# Patient Record
Sex: Female | Born: 1980 | Hispanic: No | Marital: Married | Smoking: Former smoker
Health system: Southern US, Community
[De-identification: ages and names within clinical notes are randomized; demographics above are authoritative.]

## PROBLEM LIST (undated history)

## (undated) ENCOUNTER — Inpatient Hospital Stay (HOSPITAL_COMMUNITY): Payer: Self-pay

## (undated) DIAGNOSIS — O24419 Gestational diabetes mellitus in pregnancy, unspecified control: Secondary | ICD-10-CM

## (undated) DIAGNOSIS — B999 Unspecified infectious disease: Secondary | ICD-10-CM

## (undated) DIAGNOSIS — B977 Papillomavirus as the cause of diseases classified elsewhere: Secondary | ICD-10-CM

## (undated) DIAGNOSIS — Z98891 History of uterine scar from previous surgery: Secondary | ICD-10-CM

## (undated) HISTORY — DX: History of uterine scar from previous surgery: Z98.891

## (undated) HISTORY — DX: Gestational diabetes mellitus in pregnancy, unspecified control: O24.419

## (undated) HISTORY — DX: Papillomavirus as the cause of diseases classified elsewhere: B97.7

---

## 2013-09-02 ENCOUNTER — Encounter (HOSPITAL_COMMUNITY): Payer: Self-pay | Admitting: Emergency Medicine

## 2013-09-02 DIAGNOSIS — R300 Dysuria: Secondary | ICD-10-CM | POA: Insufficient documentation

## 2013-09-02 NOTE — ED Notes (Signed)
The pt is having painful burning urination since yesterday.  Hx of utis and she thinks this is the same.  lmp 5 days

## 2013-09-03 ENCOUNTER — Emergency Department (HOSPITAL_COMMUNITY)
Admission: EM | Admit: 2013-09-03 | Discharge: 2013-09-03 | Discharge status: Against Medical Advice | Payer: Medicaid Other | Attending: Emergency Medicine | Admitting: Emergency Medicine

## 2013-09-03 LAB — PREGNANCY, URINE: Preg Test, Ur: NEGATIVE

## 2013-09-03 LAB — URINALYSIS, ROUTINE W REFLEX MICROSCOPIC
Glucose, UA: NEGATIVE mg/dL
Leukocytes, UA: NEGATIVE
Specific Gravity, Urine: 1.021 (ref 1.005–1.030)
pH: 6.5 (ref 5.0–8.0)

## 2013-09-03 NOTE — ED Notes (Signed)
Delay explained to pt.  

## 2013-09-06 ENCOUNTER — Encounter (HOSPITAL_COMMUNITY): Payer: Self-pay | Admitting: Emergency Medicine

## 2013-09-06 ENCOUNTER — Emergency Department (HOSPITAL_COMMUNITY): Payer: Medicaid Other

## 2013-09-06 ENCOUNTER — Emergency Department (HOSPITAL_COMMUNITY)
Admission: EM | Admit: 2013-09-06 | Discharge: 2013-09-06 | Disposition: A | Discharge status: Home / Self Care | Payer: Medicaid Other | Attending: Emergency Medicine | Admitting: Emergency Medicine

## 2013-09-06 DIAGNOSIS — M549 Dorsalgia, unspecified: Secondary | ICD-10-CM | POA: Insufficient documentation

## 2013-09-06 DIAGNOSIS — R300 Dysuria: Secondary | ICD-10-CM | POA: Insufficient documentation

## 2013-09-06 DIAGNOSIS — R05 Cough: Secondary | ICD-10-CM | POA: Insufficient documentation

## 2013-09-06 DIAGNOSIS — N949 Unspecified condition associated with female genital organs and menstrual cycle: Secondary | ICD-10-CM | POA: Insufficient documentation

## 2013-09-06 DIAGNOSIS — N898 Other specified noninflammatory disorders of vagina: Secondary | ICD-10-CM | POA: Insufficient documentation

## 2013-09-06 DIAGNOSIS — R350 Frequency of micturition: Secondary | ICD-10-CM | POA: Insufficient documentation

## 2013-09-06 DIAGNOSIS — R059 Cough, unspecified: Secondary | ICD-10-CM | POA: Insufficient documentation

## 2013-09-06 LAB — URINALYSIS, ROUTINE W REFLEX MICROSCOPIC
Bilirubin Urine: NEGATIVE
Glucose, UA: NEGATIVE mg/dL
Nitrite: NEGATIVE
Specific Gravity, Urine: 1.011 (ref 1.005–1.030)
Urobilinogen, UA: 0.2 mg/dL (ref 0.0–1.0)

## 2013-09-06 LAB — WET PREP, GENITAL
Trich, Wet Prep: NONE SEEN
Yeast Wet Prep HPF POC: NONE SEEN

## 2013-09-06 MED ORDER — IBUPROFEN 200 MG PO TABS
600.0000 mg | ORAL_TABLET | Freq: Once | ORAL | Status: AC
  Administered 2013-09-06: 600 mg via ORAL
  Filled 2013-09-06: qty 3

## 2013-09-06 NOTE — ED Provider Notes (Signed)
CSN: [id number]     Arrival date & time 09/06/13  1111 History   First MD Initiated Contact with Patient 09/06/13 1130     Chief Complaint  Patient presents with  . Abdominal Pain   HPI 32 year old woman with PMH recurrent UTIs, ?kidney stone who presents with dysuria and suprapubic pain.   Patient was recently in ED on 12/7 for same complaints though negative UA at that time.  She was not sent home on any medication.  She states that burning with urination has worsened (total duration 1 week), and she has also started having suprapubic crampy (like menstrual period) pain since discharge.  Pain is worse after sexual intercourse.  Endorses frequency but no hematuria.  No vaginal discharge.  No constipation or diarrhea.    Occasional palpitations but no chest pain, shortness of breath, fevers, headaches, abdominal pain.     History reviewed. No pertinent past medical history. History reviewed. No pertinent past surgical history. No family history on file. History  Substance Use Topics  . Smoking status: Former Games developer  . Smokeless tobacco: Not on file  . Alcohol Use: No   OB History   Grav Para Term Preterm Abortions TAB SAB Ect Mult Living                 Review of Systems  Constitutional: Negative for fever and chills.  HENT: Negative for congestion.   Respiratory: Positive for cough. Negative for shortness of breath.   Cardiovascular: Negative for chest pain, palpitations and leg swelling.  Gastrointestinal: Negative for nausea, vomiting, abdominal pain, diarrhea, constipation and blood in stool.  Genitourinary: Positive for dysuria, frequency and pelvic pain. Negative for hematuria, vaginal bleeding, vaginal discharge and vaginal pain.  Musculoskeletal: Positive for back pain.  Neurological: Negative for dizziness, syncope, weakness and headaches.    Allergies  Review of patient's allergies indicates no known allergies.  Home Medications  multivitamin  BP 102/69  Pulse  83  Temp(Src) 98.1 F (36.7 C) (Oral)  Resp 18  SpO2 100%  LMP 08/29/2013 Physical Exam  Constitutional: She is oriented to person, place, and time. She appears well-developed and well-nourished. No distress.  HENT:  Head: Normocephalic and atraumatic.  Eyes: Conjunctivae and EOM are normal. Pupils are equal, round, and reactive to light.  Neck: Normal range of motion. Neck supple.  Cardiovascular: Normal rate, regular rhythm and normal heart sounds.   Pulmonary/Chest: Effort normal and breath sounds normal. She has no wheezes. She has no rales.  Abdominal: Soft. Bowel sounds are normal. She exhibits no distension. There is tenderness. There is no rebound and no guarding.  Mild tenderness to palpation in suprapubic area (R>L)  Genitourinary: Uterus normal. Vaginal discharge found.  Mild white discharge Bimanual exam revealed adnexal tenderness on left.   Musculoskeletal: Normal range of motion.  Neurological: She is alert and oriented to person, place, and time. No cranial nerve deficit.  Skin: Skin is warm and dry.    ED Course  Procedures (including critical care time) Labs Review Labs Reviewed  URINALYSIS, ROUTINE W REFLEX MICROSCOPIC - Abnormal; Notable for the following:    Hgb urine dipstick TRACE (*)    All other components within normal limits  WET PREP, GENITAL  GC/CHLAMYDIA PROBE AMP  URINE MICROSCOPIC-ADD ON   Imaging Review US Transvaginal Non-ob  09/06/2013   CLINICAL DATA:  Abdominal and pelvic pain.  EXAM: TRANSABDOMINAL AND TRANSVAGINAL ULTRASOUND OF PELVIS  TECHNIQUE: Both transabdominal and transvaginal ultrasound examinations of the  pelvis were performed. Transabdominal technique was performed for global imaging of the pelvis including uterus, ovaries, adnexal regions, and pelvic cul-de-sac. It was necessary to proceed with endovaginal exam following the transabdominal exam to visualize the endometrium and ovaries.  COMPARISON:  None  FINDINGS: Uterus   Measurements: 9.9 x 5.1 x 5.7 cm. No fibroids or other mass visualized.  Endometrium  Thickness: 11 mm.  No focal abnormality visualized.  Right ovary  Measurements: 3.2 x 3.6 x 3.6 cm. Normal appearance/no adnexal mass.  Left ovary  Measurements: 2.8 x 1.6 x 2.0 cm. Normal appearance/no adnexal mass.  Other findings  Trace amount of free fluid in pelvic cul-de-sac.  IMPRESSION: Normal appearance of uterus and both ovaries. No evidence of pelvic mass or other significant abnormality.   Electronically Signed   By: Myles Rosenthal M.D.   On: 09/06/2013 13:51   US Pelvis Complete  09/06/2013   CLINICAL DATA:  Abdominal and pelvic pain.  EXAM: TRANSABDOMINAL AND TRANSVAGINAL ULTRASOUND OF PELVIS  TECHNIQUE: Both transabdominal and transvaginal ultrasound examinations of the pelvis were performed. Transabdominal technique was performed for global imaging of the pelvis including uterus, ovaries, adnexal regions, and pelvic cul-de-sac. It was necessary to proceed with endovaginal exam following the transabdominal exam to visualize the endometrium and ovaries.  COMPARISON:  None  FINDINGS: Uterus  Measurements: 9.9 x 5.1 x 5.7 cm. No fibroids or other mass visualized.  Endometrium  Thickness: 11 mm.  No focal abnormality visualized.  Right ovary  Measurements: 3.2 x 3.6 x 3.6 cm. Normal appearance/no adnexal mass.  Left ovary  Measurements: 2.8 x 1.6 x 2.0 cm. Normal appearance/no adnexal mass.  Other findings  Trace amount of free fluid in pelvic cul-de-sac.  IMPRESSION: Normal appearance of uterus and both ovaries. No evidence of pelvic mass or other significant abnormality.   Electronically Signed   By: Myles Rosenthal M.D.   On: 09/06/2013 13:51    EKG Interpretation   None       MDM  Dysuria, suprapubic pain- Worsening since she was seen earlier this week.  Afebrile, VSS.  UA negative again today except for trace heme. Urine pregnancy test negative earlier this week.  Pelvic exam with mild whitish discharge, mild  left adnexal tenderness.  Wet prep negative.  Normal pelvic ultrasound.  GC/chlamydia pending.   Discharge home with ibuprofen 600 mg q8h prn pain and follow-up with Dr. Wandra Mannan of [facility] in [facility] (patient prefers to make this appointment herself).  All questions answered with use of Arabic translator on phone.         Rocco Serene, MD 09/06/13 [phone]

## 2013-09-06 NOTE — ED Provider Notes (Signed)
Medical screening examination/treatment/procedure(s) were conducted as a shared visit with non-physician practitioner(s) or resident and myself. I personally evaluated the patient during the encounter and agree with the findings and plan unless otherwise indicated.  I have personally reviewed any xrays and/ or EKG's with the provider and I agree with interpretation.  Dysuria and left pelvic pain for a few days. Nothing improving. No new discharge or bleeding, not currently pregnant. Past smoker. Exam mild suprapubic and left lower pelvic, no CMT, mild white discharge, mild left adnexal pain, abd soft/ NT. Plan for pelvic cultures and US pelvic. [place] no acute findings.  Pt has outpt fup.  Pelvic pain LLQ, Dysuria Labs Reviewed  URINALYSIS, ROUTINE W REFLEX MICROSCOPIC - Abnormal; Notable for the following:    Hgb urine dipstick TRACE (*)    All other components within normal limits  WET PREP, GENITAL  GC/CHLAMYDIA PROBE AMP  URINE MICROSCOPIC-ADD ON     US Transvaginal Non-ob  09/06/2013   CLINICAL DATA:  Abdominal and pelvic pain.  EXAM: TRANSABDOMINAL AND TRANSVAGINAL ULTRASOUND OF PELVIS  TECHNIQUE: Both transabdominal and transvaginal ultrasound examinations of the pelvis were performed. Transabdominal technique was performed for global imaging of the pelvis including uterus, ovaries, adnexal regions, and pelvic cul-de-sac. It was necessary to proceed with endovaginal exam following the transabdominal exam to visualize the endometrium and ovaries.  COMPARISON:  None  FINDINGS: Uterus  Measurements: 9.9 x 5.1 x 5.7 cm. No fibroids or other mass visualized.  Endometrium  Thickness: 11 mm.  No focal abnormality visualized.  Right ovary  Measurements: 3.2 x 3.6 x 3.6 cm. Normal appearance/no adnexal mass.  Left ovary  Measurements: 2.8 x 1.6 x 2.0 cm. Normal appearance/no adnexal mass.  Other findings  Trace amount of free fluid in pelvic cul-de-sac.  IMPRESSION: Normal appearance of uterus and both  ovaries. No evidence of pelvic mass or other significant abnormality.   Electronically Signed   By: Myles Rosenthal M.D.   On: 09/06/2013 13:51   US Pelvis Complete  09/06/2013   CLINICAL DATA:  Abdominal and pelvic pain.  EXAM: TRANSABDOMINAL AND TRANSVAGINAL ULTRASOUND OF PELVIS  TECHNIQUE: Both transabdominal and transvaginal ultrasound examinations of the pelvis were performed. Transabdominal technique was performed for global imaging of the pelvis including uterus, ovaries, adnexal regions, and pelvic cul-de-sac. It was necessary to proceed with endovaginal exam following the transabdominal exam to visualize the endometrium and ovaries.  COMPARISON:  None  FINDINGS: Uterus  Measurements: 9.9 x 5.1 x 5.7 cm. No fibroids or other mass visualized.  Endometrium  Thickness: 11 mm.  No focal abnormality visualized.  Right ovary  Measurements: 3.2 x 3.6 x 3.6 cm. Normal appearance/no adnexal mass.  Left ovary  Measurements: 2.8 x 1.6 x 2.0 cm. Normal appearance/no adnexal mass.  Other findings  Trace amount of free fluid in pelvic cul-de-sac.  IMPRESSION: Normal appearance of uterus and both ovaries. No evidence of pelvic mass or other significant abnormality.   Electronically Signed   By: Myles Rosenthal M.D.   On: 09/06/2013 13:51     Enid Skeens, MD 09/06/13 1651

## 2013-09-06 NOTE — ED Notes (Signed)
uti s/s for a few days was seen here but not better

## 2013-09-07 LAB — GC/CHLAMYDIA PROBE AMP
CT Probe RNA: NEGATIVE
GC Probe RNA: NEGATIVE

## 2013-12-10 ENCOUNTER — Emergency Department (HOSPITAL_BASED_OUTPATIENT_CLINIC_OR_DEPARTMENT_OTHER)
Admission: EM | Admit: 2013-12-10 | Discharge: 2013-12-11 | Disposition: A | Discharge status: Home / Self Care | Payer: Medicaid Other | Attending: Emergency Medicine | Admitting: Emergency Medicine

## 2013-12-10 ENCOUNTER — Encounter (HOSPITAL_BASED_OUTPATIENT_CLINIC_OR_DEPARTMENT_OTHER): Payer: Self-pay | Admitting: Emergency Medicine

## 2013-12-10 DIAGNOSIS — Z87891 Personal history of nicotine dependence: Secondary | ICD-10-CM | POA: Insufficient documentation

## 2013-12-10 DIAGNOSIS — N390 Urinary tract infection, site not specified: Secondary | ICD-10-CM | POA: Insufficient documentation

## 2013-12-10 DIAGNOSIS — Z3202 Encounter for pregnancy test, result negative: Secondary | ICD-10-CM | POA: Insufficient documentation

## 2013-12-10 LAB — URINE MICROSCOPIC-ADD ON

## 2013-12-10 LAB — URINALYSIS, ROUTINE W REFLEX MICROSCOPIC
Bilirubin Urine: NEGATIVE
Glucose, UA: NEGATIVE mg/dL
Ketones, ur: NEGATIVE mg/dL
Nitrite: NEGATIVE
PH: 6 (ref 5.0–8.0)
Protein, ur: NEGATIVE mg/dL
SPECIFIC GRAVITY, URINE: 1.021 (ref 1.005–1.030)
UROBILINOGEN UA: 0.2 mg/dL (ref 0.0–1.0)

## 2013-12-10 LAB — PREGNANCY, URINE: Preg Test, Ur: NEGATIVE

## 2013-12-10 NOTE — ED Notes (Signed)
Dysuria x 3 days 

## 2013-12-10 NOTE — ED Notes (Signed)
PA at bedside.

## 2013-12-11 LAB — BASIC METABOLIC PANEL
BUN: 13 mg/dL (ref 6–23)
CALCIUM: 9.8 mg/dL (ref 8.4–10.5)
CO2: 27 mEq/L (ref 19–32)
Chloride: 98 mEq/L (ref 96–112)
Creatinine, Ser: 0.5 mg/dL (ref 0.50–1.10)
GFR calc non Af Amer: >90 mL/min (ref >90)
Glucose, Bld: 98 mg/dL (ref 70–99)
POTASSIUM: 4 meq/L (ref 3.7–5.3)
Sodium: 138 mEq/L (ref 137–147)

## 2013-12-11 MED ORDER — SULFAMETHOXAZOLE-TRIMETHOPRIM 800-160 MG PO TABS: 1.0000 | ORAL_TABLET | Freq: Two times a day (BID) | ORAL | Status: DC

## 2013-12-11 MED ORDER — PHENAZOPYRIDINE HCL 200 MG PO TABS: 200.0000 mg | ORAL_TABLET | Freq: Three times a day (TID) | ORAL | Status: DC

## 2013-12-11 NOTE — Discharge Instructions (Signed)
Urinary Tract Infection  Urinary tract infections (UTIs) can develop anywhere along your urinary tract. Your urinary tract is your body's drainage system for removing wastes and extra water. Your urinary tract includes two kidneys, two ureters, a bladder, and a urethra. Your kidneys are a pair of bean-shaped organs. Each kidney is about the size of your fist. They are located below your ribs, one on each side of your spine.  CAUSES  Infections are caused by microbes, which are microscopic organisms, including fungi, viruses, and bacteria. These organisms are so small that they can only be seen through a microscope. Bacteria are the microbes that most commonly cause UTIs.  SYMPTOMS   Symptoms of UTIs may vary by age and gender of the patient and by the location of the infection. Symptoms in young women typically include a frequent and intense urge to urinate and a painful, burning feeling in the bladder or urethra during urination. Older women and men are more likely to be tired, shaky, and weak and have muscle aches and abdominal pain. A fever may mean the infection is in your kidneys. Other symptoms of a kidney infection include pain in your back or sides below the ribs, nausea, and vomiting.  DIAGNOSIS  To diagnose a UTI, your caregiver will ask you about your symptoms. Your caregiver also will ask to provide a urine sample. The urine sample will be tested for bacteria and white blood cells. White blood cells are made by your body to help fight infection.  TREATMENT   Typically, UTIs can be treated with medication. Because most UTIs are caused by a bacterial infection, they usually can be treated with the use of antibiotics. The choice of antibiotic and length of treatment depend on your symptoms and the type of bacteria causing your infection.  HOME CARE INSTRUCTIONS   If you were prescribed antibiotics, take them exactly as your caregiver instructs you. Finish the medication even if you feel better after you  have only taken some of the medication.   Drink enough water and fluids to keep your urine clear or pale yellow.   Avoid caffeine, tea, and carbonated beverages. They tend to irritate your bladder.   Empty your bladder often. Avoid holding urine for long periods of time.   Empty your bladder before and after sexual intercourse.   After a bowel movement, women should cleanse from front to back. Use each tissue only once.  SEEK MEDICAL CARE IF:    You have back pain.   You develop a fever.   Your symptoms do not begin to resolve within 3 days.  SEEK IMMEDIATE MEDICAL CARE IF:    You have severe back pain or lower abdominal pain.   You develop chills.   You have nausea or vomiting.   You have continued burning or discomfort with urination.  MAKE SURE YOU:    Understand these instructions.   Will watch your condition.   Will get help right away if you are not doing well or get worse.  Document Released: 06/23/2005 Document Revised: 03/14/2012 Document Reviewed: 10/22/2011  ExitCare Patient Information 2014 [employer].

## 2013-12-19 NOTE — ED Provider Notes (Signed)
CSN: [id number]     Arrival date & time 12/10/13  1948 History   First MD Initiated Contact with Patient 12/10/13 2247     Chief Complaint  Patient presents with  . Dysuria     (Consider location/radiation/quality/duration/timing/severity/associated sxs/prior Treatment) Patient is a 33 y.o. female presenting with dysuria. The history is provided by the patient. No language interpreter was used.  Dysuria Pain quality:  Burning Pain severity:  Moderate Duration:  3 days Associated symptoms: flank pain   Associated symptoms: no abdominal pain, no fever, no nausea and no vaginal discharge   Associated symptoms comment:  She reports recurrent symptoms of dysuria for the past 3 days like previously diagnosed UTI's. She is concerned because she continues to have recurrent infection. She denies vaginal discharge, bleeding, fever, nausea or vomiting. She has intermittent bilateral flank pain.   History reviewed. No pertinent past medical history. History reviewed. No pertinent past surgical history. No family history on file. History  Substance Use Topics  . Smoking status: Former Research scientist (life sciences)  . Smokeless tobacco: Not on file  . Alcohol Use: No   OB History   Grav Para Term Preterm Abortions TAB SAB Ect Mult Living                 Review of Systems  Constitutional: Negative for fever and chills.  Respiratory: Negative.   Cardiovascular: Negative.   Gastrointestinal: Negative.  Negative for nausea and abdominal pain.  Genitourinary: Positive for dysuria, frequency, flank pain and pelvic pain. Negative for vaginal bleeding and vaginal discharge.  Musculoskeletal: Negative.   Skin: Negative.   Neurological: Negative.       Allergies  Review of patient's allergies indicates no known allergies.  Home Medications    BP 100/61  Pulse 66  Temp(Src) 99.5 F (37.5 C) (Oral)  Resp 18  Wt 155 lb (70.308 kg)  SpO2 100%  LMP 12/06/2013 Physical Exam  Constitutional: She is oriented  to person, place, and time. She appears well-developed and well-nourished.  Neck: Normal range of motion.  Pulmonary/Chest: Effort normal.  Abdominal: There is no tenderness. There is no rebound and no guarding.  Neurological: She is alert and oriented to person, place, and time.  Skin: Skin is warm and dry.  Psychiatric: She has a normal mood and affect.    ED Course  Procedures (including critical care time) Labs Review Labs Reviewed  URINALYSIS, ROUTINE W REFLEX MICROSCOPIC - Abnormal; Notable for the following:    Hgb urine dipstick SMALL (*)    Leukocytes, UA MODERATE (*)    All other components within normal limits  URINE MICROSCOPIC-ADD ON - Abnormal; Notable for the following:    Bacteria, UA FEW (*)    All other components within normal limits  PREGNANCY, URINE  BASIC METABOLIC PANEL   Imaging Review No results found.   EKG Interpretation None      MDM   Final diagnoses:  UTI (lower urinary tract infection)    Normal renal functions, urine has been cultured due to recurrent infection and small amount of WBC now. Rx Septra and urology follow up.    Dewaine Oats, PA-C 12/19/13 1711

## 2013-12-20 NOTE — ED Provider Notes (Signed)
Medical screening examination/treatment/procedure(s) were performed by non-physician practitioner and as supervising physician I was immediately available for consultation/collaboration.   EKG Interpretation None        Neta Ehlers, MD 12/20/13 1156

## 2014-12-31 ENCOUNTER — Ambulatory Visit: Payer: Medicaid Other

## 2015-02-05 IMAGING — US US TRANSVAGINAL NON-OB
1 series · 14 of 25 positions shown · non-contrast
Comparison: None

CLINICAL DATA: Abdominal and pelvic pain.

EXAM:
TRANSABDOMINAL AND TRANSVAGINAL ULTRASOUND OF PELVIS
TECHNIQUE: Both transabdominal and transvaginal ultrasound examinations of the
pelvis were performed. Transabdominal technique was performed for
global imaging of the pelvis including uterus, ovaries, adnexal
regions, and pelvic cul-de-sac. It was necessary to proceed with
endovaginal exam following the transabdominal exam to visualize the
endometrium and ovaries.

[Series 1: us transvaginal non-ob · 0.21mm/px · 14 of 70 slices shown]
[im 1/70]
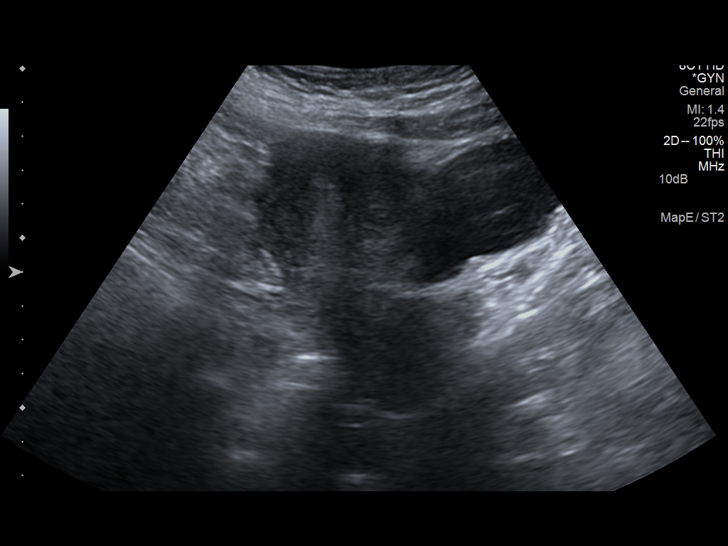
[im 6/70]
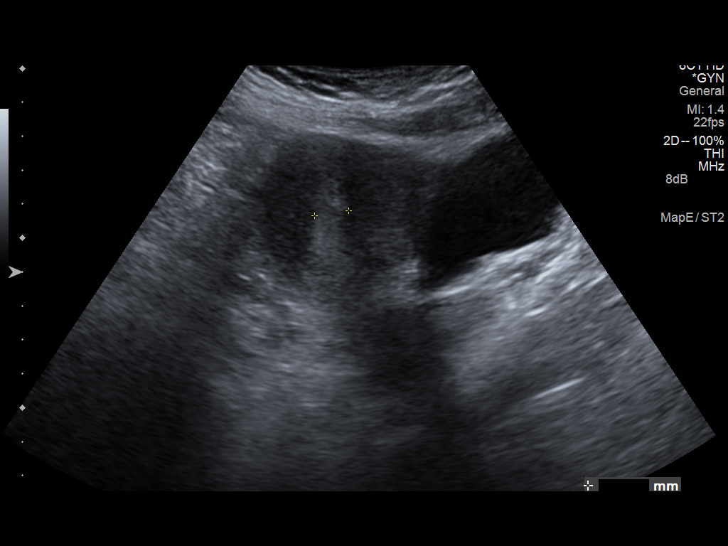
[im 12/70]
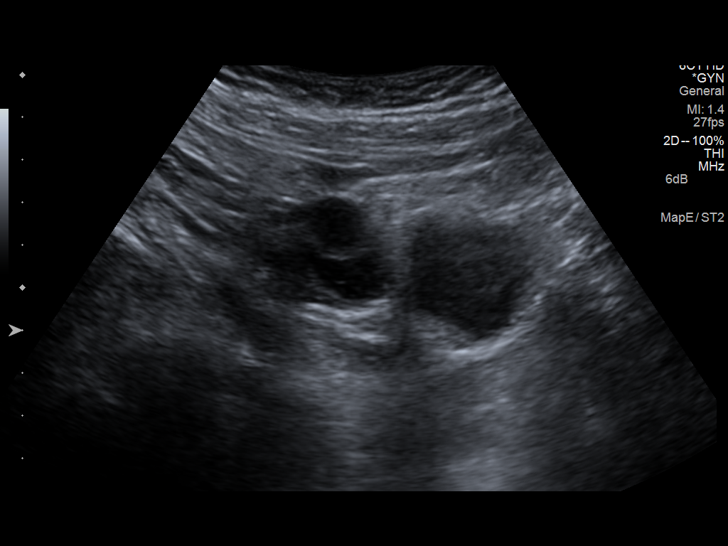
[im 18/70]
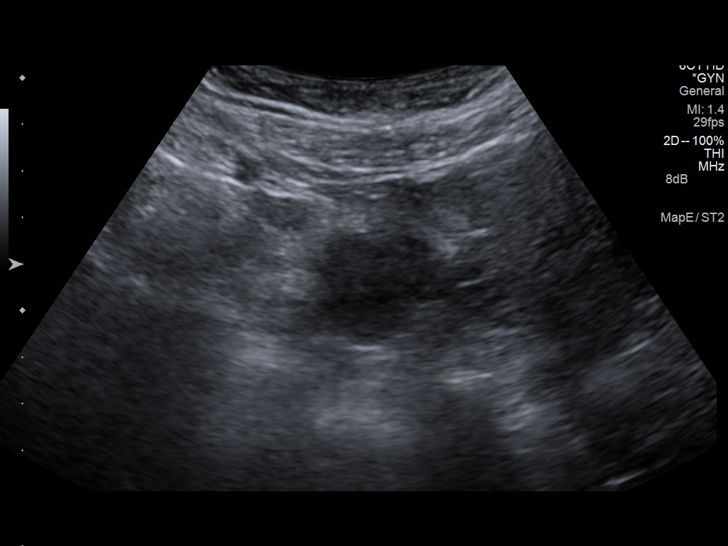
[im 24/70]
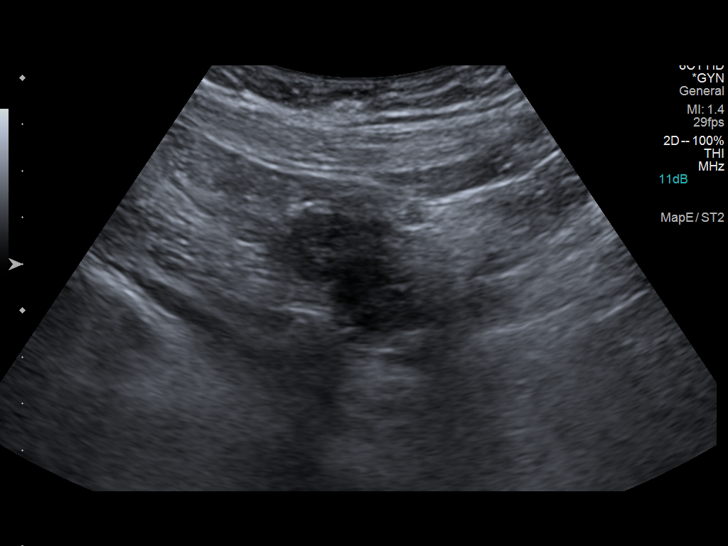
[im 26/70]
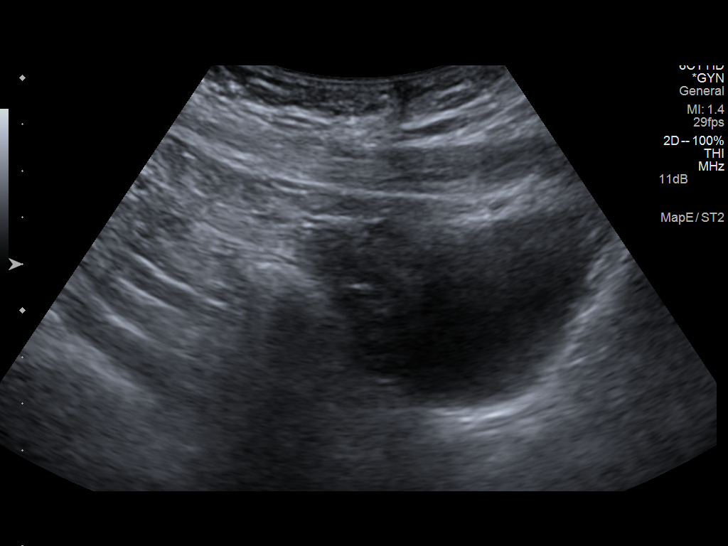
[im 32/70]
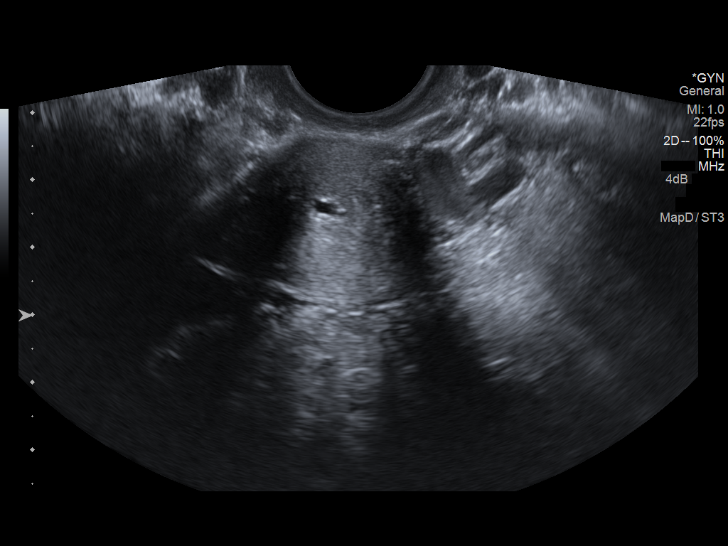
[im 38/70]
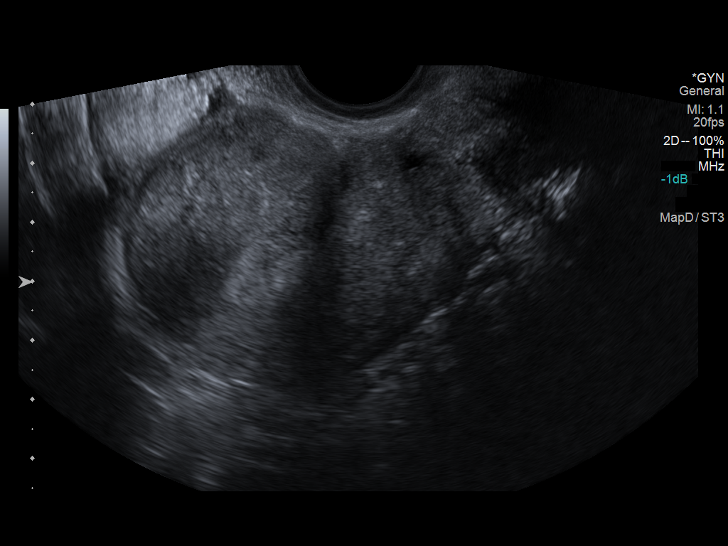
[im 44/70]
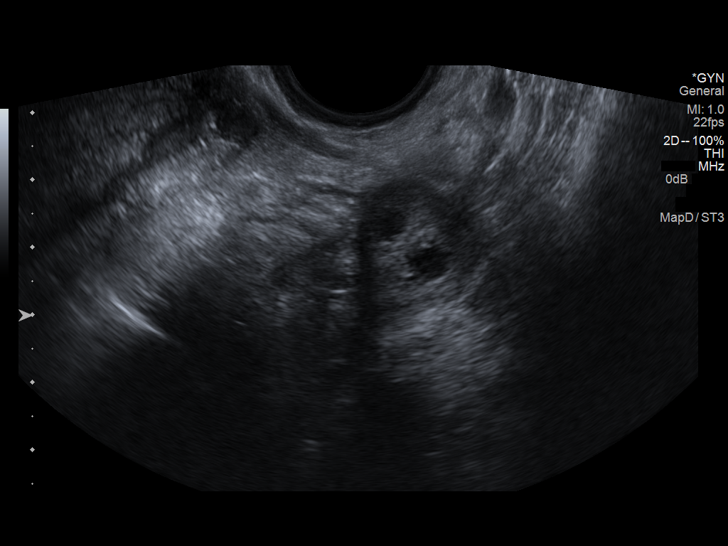
[im 47/70]
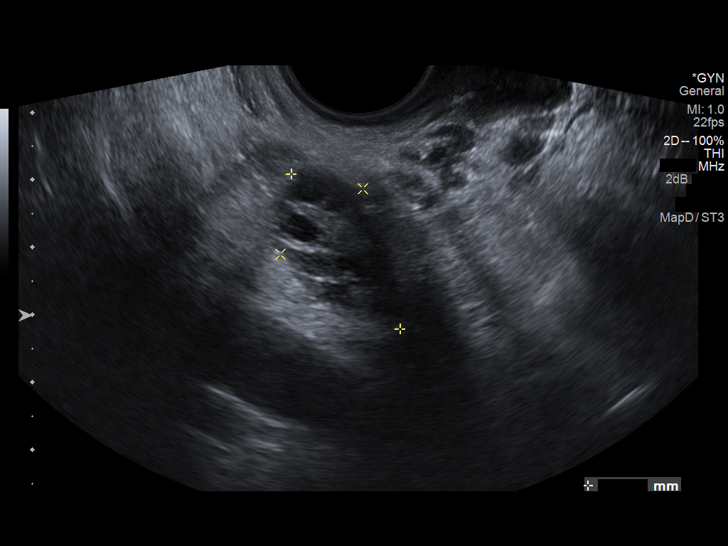
[im 52/70]
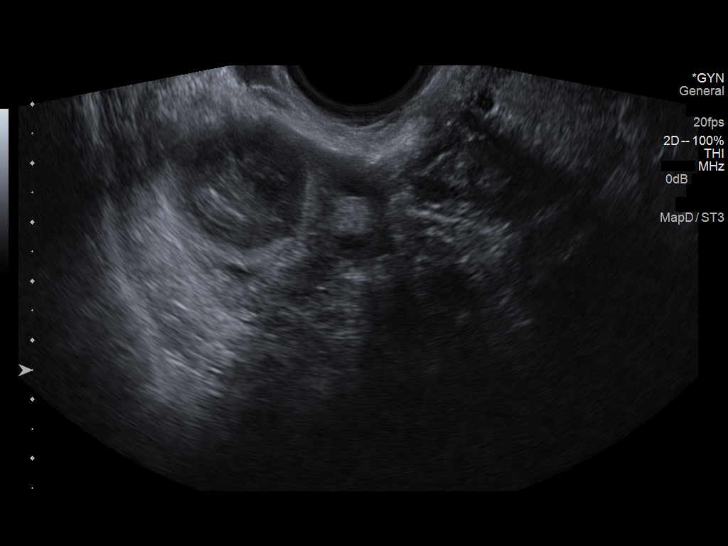
[im 58/70]
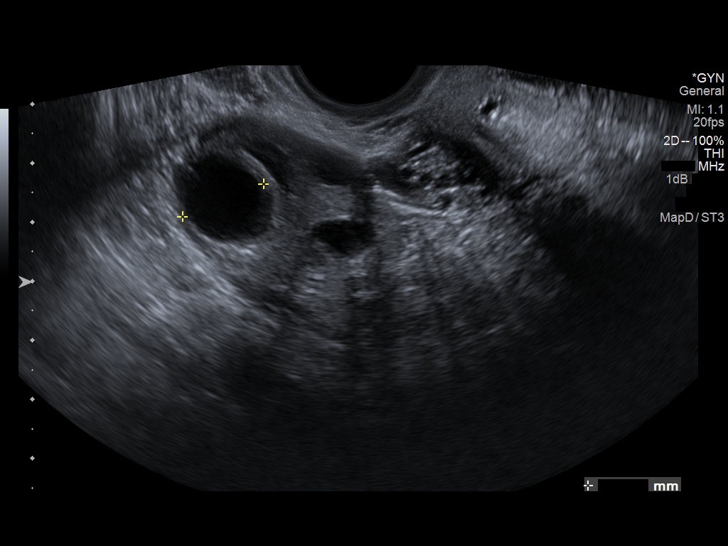
[im 64/70]
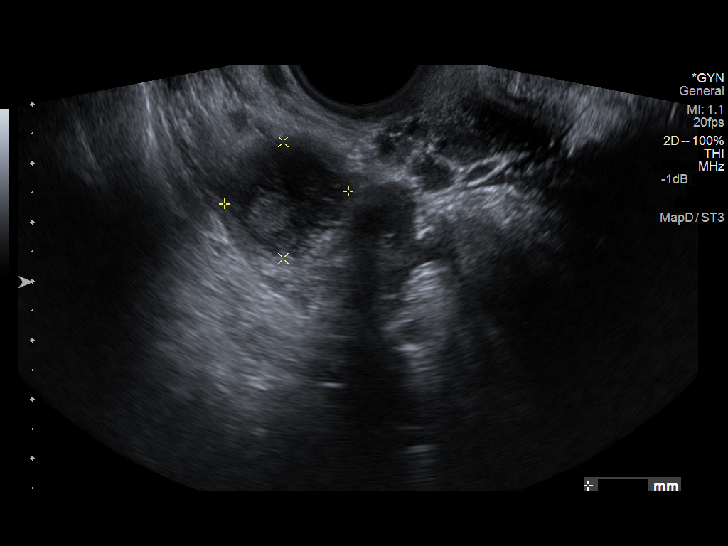
[im 70/70]
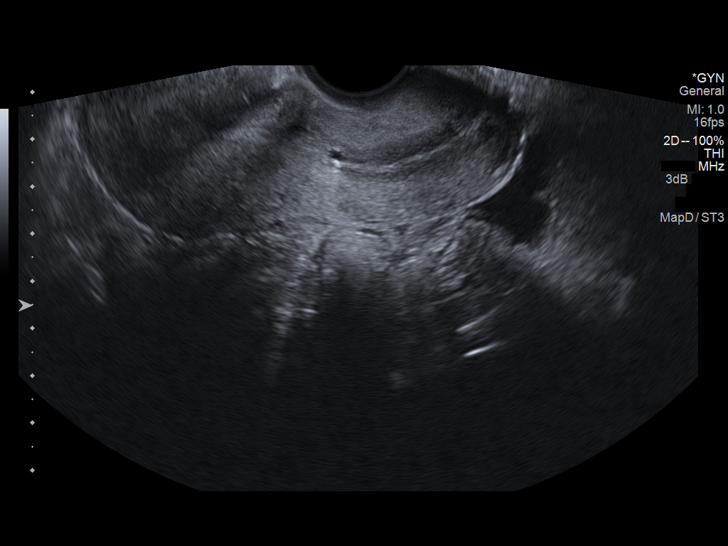

[14 of 25 positions shown; findings below may reference images not displayed]

FINDINGS: Uterus

Measurements: 9.9 x 5.1 x 5.7 cm. No fibroids or other mass
visualized.

Endometrium

Thickness: 11 mm.  No focal abnormality visualized.

Right ovary

Measurements: 3.2 x 3.6 x 3.6 cm. Normal appearance/no adnexal mass.

Left ovary

Measurements: 2.8 x 1.6 x 2.0 cm. Normal appearance/no adnexal mass.

Other findings

Trace amount of free fluid in pelvic cul-de-sac.
IMPRESSION: Normal appearance of uterus and both ovaries. No evidence of pelvic
mass or other significant abnormality.

## 2017-06-01 ENCOUNTER — Inpatient Hospital Stay (HOSPITAL_COMMUNITY): Admit: 2017-06-01 | Payer: Medicaid Other

## 2017-06-01 ENCOUNTER — Ambulatory Visit: Payer: Self-pay

## 2017-06-01 ENCOUNTER — Inpatient Hospital Stay (HOSPITAL_BASED_OUTPATIENT_CLINIC_OR_DEPARTMENT_OTHER): Payer: Medicaid Other

## 2017-06-01 ENCOUNTER — Inpatient Hospital Stay (HOSPITAL_COMMUNITY)
Admission: AD | Admit: 2017-06-01 | Discharge: 2017-06-01 | Disposition: A | Discharge status: Home / Self Care | Payer: Medicaid Other | Source: Ambulatory Visit | Attending: Obstetrics and Gynecology | Admitting: Obstetrics and Gynecology

## 2017-06-01 ENCOUNTER — Encounter (HOSPITAL_COMMUNITY): Payer: Self-pay | Admitting: *Deleted

## 2017-06-01 DIAGNOSIS — Z79899 Other long term (current) drug therapy: Secondary | ICD-10-CM | POA: Insufficient documentation

## 2017-06-01 DIAGNOSIS — Z3A11 11 weeks gestation of pregnancy: Secondary | ICD-10-CM | POA: Diagnosis not present

## 2017-06-01 DIAGNOSIS — O26891 Other specified pregnancy related conditions, first trimester: Secondary | ICD-10-CM | POA: Insufficient documentation

## 2017-06-01 DIAGNOSIS — O9989 Other specified diseases and conditions complicating pregnancy, childbirth and the puerperium: Secondary | ICD-10-CM

## 2017-06-01 DIAGNOSIS — M79609 Pain in unspecified limb: Secondary | ICD-10-CM | POA: Diagnosis present

## 2017-06-01 DIAGNOSIS — O34219 Maternal care for unspecified type scar from previous cesarean delivery: Secondary | ICD-10-CM | POA: Insufficient documentation

## 2017-06-01 DIAGNOSIS — Z87891 Personal history of nicotine dependence: Secondary | ICD-10-CM | POA: Diagnosis not present

## 2017-06-01 DIAGNOSIS — R109 Unspecified abdominal pain: Secondary | ICD-10-CM | POA: Insufficient documentation

## 2017-06-01 DIAGNOSIS — M79662 Pain in left lower leg: Secondary | ICD-10-CM | POA: Insufficient documentation

## 2017-06-01 DIAGNOSIS — Z833 Family history of diabetes mellitus: Secondary | ICD-10-CM | POA: Insufficient documentation

## 2017-06-01 DIAGNOSIS — O0931 Supervision of pregnancy with insufficient antenatal care, first trimester: Secondary | ICD-10-CM

## 2017-06-01 DIAGNOSIS — M7989 Other specified soft tissue disorders: Secondary | ICD-10-CM | POA: Insufficient documentation

## 2017-06-01 HISTORY — DX: Unspecified infectious disease: B99.9

## 2017-06-01 MED ORDER — CYCLOBENZAPRINE HCL 5 MG PO TABS: 5.0000 mg | ORAL_TABLET | Freq: Two times a day (BID) | ORAL | 0 refills | Status: DC | PRN

## 2017-06-01 NOTE — MAU Note (Signed)
Urine in lab 

## 2017-06-01 NOTE — Progress Notes (Signed)
Assumed care of pt. Informed pt waiting on results of test and may take awhile before being discharged.

## 2017-06-01 NOTE — MAU Provider Note (Signed)
  History     CSN: [id number]  Arrival date and time: 06/01/17 6073   First Provider Initiated Contact with Patient 06/01/17 1102      Chief Complaint  Patient presents with  . Leg Swelling  . Abdominal Pain  . Leg Pain   HPI  [name] is a 36 yo G3P2002 at 11.[redacted] wks gestation presenting with complaints of lower LT popliteal pain.  She reports flying from [place] to [place] the end of August then rode in a car from [place] 2 days ago.  She states the pain increases when she gets up to walk.  She reports "some swelling in both her legs".  Past Medical History:  Diagnosis Date  . Infection    UTI    Past Surgical History:  Procedure Laterality Date  . CESAREAN SECTION      Family History  Problem Relation Age of Onset  . Diabetes Father   . Asthma Neg Hx   . Cancer Neg Hx   . Hearing loss Neg Hx   . Hypertension Neg Hx   . Stroke Neg Hx     Social History  Substance Use Topics  . Smoking status: Former Research scientist (life sciences)  . Smokeless tobacco: Never Used     Comment: occ smoker, social not with preg  . Alcohol use No    Allergies: No Known Allergies  Prescriptions Prior to Admission  Medication Sig Dispense Refill Last Dose  . folic acid (FOLVITE) 710 MCG tablet Take 400 mcg by mouth daily.   Past Week at Unknown time  . UNABLE TO FIND Take 1 tablet by mouth every 6 (six) hours as needed (for pain). Med Name: bangol or pangol   05/31/2017 at Unknown time    Review of Systems  Constitutional: Negative.   HENT: Negative.   Eyes: Negative.   Respiratory: Negative.   Cardiovascular: Positive for leg swelling.  Endocrine: Negative.   Genitourinary: Negative.   Musculoskeletal: Negative.   Skin: Negative.   Allergic/Immunologic: Negative.   Neurological: Negative.   Hematological: Negative.   Psychiatric/Behavioral: Negative.    Physical Exam   Blood pressure 107/66, pulse 90, temperature 98.4 F (36.9 C), temperature source Oral, resp. rate 16, weight 83.6  kg (184 lb 6.4 oz), last menstrual period 03/11/2017, SpO2 100 %.  Physical Exam  Constitutional: She appears well-developed and well-nourished.  HENT:  Head: Normocephalic.  Eyes: Pupils are equal, round, and reactive to light.  Neck: Normal range of motion.  Musculoskeletal:       Left knee: Tenderness found.    MAU Course  Procedures  MDM FHTs by doppler: 147 bpm  Venous Doppler of Lower LT Extremity: *PRELIMINARY RESULTS* Vascular Ultrasound Left lower extremity venous duplex has been completed.  Preliminary findings: No evidence of deep vein thrombosis or baker's cysts in the left lower extremity.  Preliminary results given to [facility] @ 12:40. Everrett Coombe 06/01/2017, 12:41 PM  Assessment and Plan  Popliteal pain - Rx for Flexeril 5 mg po BID prn MSK pain - Stay well-hydrated with 8-10 glasses of water daily  No Prenatal Care in 1st trimester - Establish [facility] with OB provider of choice  - List of [facility] providers given  Discharge home Patient verbalized an understanding of the plan of care and agrees.   Laury Deep, MSN, CNM 06/01/2017, 11:11 AM

## 2017-06-01 NOTE — MAU Note (Signed)
Having pain in left leg only, behind the knee.  First started after arriving in [place].   (traveled from [place] to [place] in July, then drove here). Leg has been swelling and hurting since, sometimes is hard to walk.  Also having pain in lower abd, started at beginning of preg.

## 2017-06-01 NOTE — Discharge Instructions (Signed)
[facility] Providers  ° ° °[facility]       Phone: [phone] ° °[facility] Phone: [phone] ° °[facility] [place]  Phone: [phone] ° °[facility] [place] [facility]  Phone: [phone] ° °[facility]  Phone: [phone] ° °[facility]       Phone: [phone] ° °[facility]    Phone: [phone]  ° °[facility] ([place]    Phone: [phone] ° °[facility]    Phone: [phone] ° °[facility]    Phone: [phone] ° °[facility]    Phone: [phone] ° °[facility]       Phone: [phone]  ° °[facility]  Phone: [phone] ° °[facility]    Phone: [phone] ° °[facility]    Phone: [phone] ° °[facility]      Phone: [phone] ° °[facility]    Phone: [phone] ° °

## 2017-06-01 NOTE — MAU Note (Signed)
Leg measurement: rt calf 16 in, above knee 19 in; left calf 16 in, above knee 19.5in

## 2017-06-01 NOTE — Progress Notes (Signed)
*  PRELIMINARY RESULTS* Vascular Ultrasound Left lower extremity venous duplex has been completed.  Preliminary findings: No evidence of deep vein thrombosis or baker's cysts in the left lower extremity.  Preliminary results given to [facility] @ 12:40.   Everrett Coombe 06/01/2017, 12:41 PM

## 2017-06-13 ENCOUNTER — Encounter: Payer: Self-pay | Admitting: Family Medicine

## 2017-06-13 ENCOUNTER — Other Ambulatory Visit (HOSPITAL_COMMUNITY)
Admission: RE | Admit: 2017-06-13 | Discharge: 2017-06-13 | Disposition: A | Discharge status: Home / Self Care | Payer: Medicaid Other | Source: Ambulatory Visit | Attending: Family Medicine | Admitting: Family Medicine

## 2017-06-13 ENCOUNTER — Ambulatory Visit (INDEPENDENT_AMBULATORY_CARE_PROVIDER_SITE_OTHER): Payer: Medicaid Other | Admitting: Family Medicine

## 2017-06-13 VITALS — BP 111/64 | HR 101 | Ht 66.5 in | Wt 183.0 lb

## 2017-06-13 DIAGNOSIS — Z3481 Encounter for supervision of other normal pregnancy, first trimester: Secondary | ICD-10-CM

## 2017-06-13 DIAGNOSIS — Z3480 Encounter for supervision of other normal pregnancy, unspecified trimester: Secondary | ICD-10-CM

## 2017-06-13 DIAGNOSIS — Z98891 History of uterine scar from previous surgery: Secondary | ICD-10-CM

## 2017-06-13 DIAGNOSIS — Z3401 Encounter for supervision of normal first pregnancy, first trimester: Secondary | ICD-10-CM | POA: Insufficient documentation

## 2017-06-13 DIAGNOSIS — O26891 Other specified pregnancy related conditions, first trimester: Secondary | ICD-10-CM

## 2017-06-13 DIAGNOSIS — M79606 Pain in leg, unspecified: Secondary | ICD-10-CM

## 2017-06-13 DIAGNOSIS — N390 Urinary tract infection, site not specified: Secondary | ICD-10-CM | POA: Insufficient documentation

## 2017-06-13 DIAGNOSIS — O09529 Supervision of elderly multigravida, unspecified trimester: Secondary | ICD-10-CM

## 2017-06-13 HISTORY — DX: History of uterine scar from previous surgery: Z98.891

## 2017-06-13 MED ORDER — FERROUS SULFATE 325 (65 FE) MG PO TABS: 325.0000 mg | ORAL_TABLET | Freq: Two times a day (BID) | ORAL | 1 refills | Status: DC

## 2017-06-13 NOTE — Progress Notes (Signed)
Subjective:  Belinda Blake is a G3P2002 [redacted]w[redacted]d being seen today for her first obstetrical visit.  Her obstetrical history is significant for advanced maternal age and two prior cesarean without labor. Patient does intend to breast feed. Pregnancy history fully reviewed.  Patient reports leg, knee pain only at night. Was seen in [facility] - US shows no DVT. Hasn't tried any medication. Massaging muscles improve. Only started when she became pregnant..  BP 111/64   Pulse (!) 101   Ht 5' 6.5" (1.689 m)   Wt 183 lb (83 kg)   LMP 03/11/2017 (Exact Date)   BMI 29.09 kg/m   HISTORY: OB History  Gravida Para Term Preterm AB Living  3 2 2  0 0 2  SAB TAB Ectopic Multiple Live Births          2    # Outcome Date GA Lbr Len/2nd Weight Sex Delivery Anes PTL Lv  3 Current           2 Term    9 lb 3 oz (4.167 kg) M CS-Unspec  N LIV     Complications: Fetal Intolerance  1 Term    8 lb 8 oz (3.856 kg) M CS-Unspec  N LIV    Obstetric Comments  #1 nuchal cord, baby did not tolerate labor  #2 scheduled repeat c/s    Past Medical History:  Diagnosis Date  . HPV in female   . Infection    UTI    Past Surgical History:  Procedure Laterality Date  . CESAREAN SECTION      Family History  Problem Relation Age of Onset  . Diabetes Father   . Asthma Neg Hx   . Cancer Neg Hx   . Hearing loss Neg Hx   . Hypertension Neg Hx   . Stroke Neg Hx      Exam    Uterus:     Pelvic Exam:    Perineum: No Hemorrhoids, Normal Perineum   Vulva: normal, Bartholin's, Urethra, Skene's normal   Vagina:  normal mucosa   Cervix: no bleeding following Pap, no cervical motion tenderness and no lesions   Adnexa: normal adnexa and no mass, fullness, tenderness   Bony Pelvis: gynecoid  System: Breast:  normal appearance, no masses or tenderness, Inspection negative, No nipple retraction or dimpling, No nipple discharge or bleeding, No axillary or supraclavicular adenopathy   Skin: normal coloration and turgor,  no rashes    Neurologic: oriented, normal, gait normal; reflexes normal and symmetric, no focal deficits   Extremities: normal strength, tone, and muscle mass, no deformities, no erythema, induration, or nodules   HEENT PERRLA and extra ocular movement intact   Mouth/Teeth mucous membranes moist, pharynx normal without lesions   Neck supple and no masses   Cardiovascular: regular rate and rhythm, no murmurs or gallops   Respiratory:  appears well, vitals normal, no respiratory distress, acyanotic, normal RR, ear and throat exam is normal, neck free of mass or lymphadenopathy, chest clear, no wheezing, crepitations, rhonchi, normal symmetric air entry   Abdomen: soft, non-tender; bowel sounds normal; no masses,  no organomegaly   Urinary: urethral meatus normal      Assessment:    Pregnancy: [id number] Patient Active Problem List   Diagnosis Date Noted  . Supervision of other normal pregnancy, antepartum 06/13/2017  . History of cesarean section 06/13/2017  . Frequent UTI 06/13/2017  . No prenatal care in current pregnancy in first trimester 06/01/2017      Plan:  1. Supervision of other normal pregnancy, antepartum Genetic Screening discussed NIPS requested.  Ultrasound discussed; fetal survey: requested.  Follow up in 4 weeks.  - Obstetric Panel, Including HIV - Cytology - PAP - CULTURE, URINE COMPREHENSIVE - [place] MFM OB DETAIL +14 WK; Future  2. History of cesarean section Discussed repeat cesarean vs TOLAC. Discussed that if she would like to have TOLAC, then she would need to have spontaneous labor.  3. AMA (advanced maternal age) multigravida 35+, unspecified trimester NIPS. Detailed [place].   4. Frequent UTI Urine culture  5. Leg pain ? Restless leg. Check blood counts. Start iron. DVT study already done. If this is not effective, then will trial flexeril.  Problem list reviewed and updated. 75% of 45 min visit spent on counseling and coordination of care.      Truett Mainland 06/13/2017

## 2017-06-14 ENCOUNTER — Other Ambulatory Visit: Payer: Self-pay | Admitting: Family Medicine

## 2017-06-14 ENCOUNTER — Other Ambulatory Visit: Payer: Medicaid Other

## 2017-06-14 DIAGNOSIS — Z3490 Encounter for supervision of normal pregnancy, unspecified, unspecified trimester: Secondary | ICD-10-CM

## 2017-06-14 NOTE — Progress Notes (Unsigned)
Patient presents for lab draw. Panorama collected. Kathrene Alu RNBSN

## 2017-06-15 LAB — OBSTETRIC PANEL, INCLUDING HIV
Antibody Screen: NEGATIVE
BASOS ABS: 0 10*3/uL (ref 0.0–0.2)
Basos: 1 %
EOS (ABSOLUTE): 0.1 10*3/uL (ref 0.0–0.4)
EOS: 1 %
HEMOGLOBIN: 9.8 g/dL — AB (ref 11.1–15.9)
HEP B S AG: NEGATIVE
HIV Screen 4th Generation wRfx: NONREACTIVE
Hematocrit: 31.5 % — ABNORMAL LOW (ref 34.0–46.6)
IMMATURE GRANULOCYTES: 0 %
Immature Grans (Abs): 0 10*3/uL (ref 0.0–0.1)
LYMPHS ABS: 1.8 10*3/uL (ref 0.7–3.1)
Lymphs: 27 %
MCH: 27.1 pg (ref 26.6–33.0)
MCHC: 31.1 g/dL — ABNORMAL LOW (ref 31.5–35.7)
MCV: 87 fL (ref 79–97)
MONOCYTES: 6 %
Monocytes Absolute: 0.4 10*3/uL (ref 0.1–0.9)
Neutrophils Absolute: 4.4 10*3/uL (ref 1.4–7.0)
Neutrophils: 65 %
Platelets: 279 10*3/uL (ref 150–379)
RBC: 3.62 x10E6/uL — ABNORMAL LOW (ref 3.77–5.28)
RDW: 13.8 % (ref 12.3–15.4)
RH TYPE: POSITIVE
RPR: NONREACTIVE
Rubella Antibodies, IGG: 22.7 index (ref >0.99)
WBC: 6.6 10*3/uL (ref 3.4–10.8)

## 2017-06-15 LAB — CYTOLOGY - PAP
Chlamydia: NEGATIVE
DIAGNOSIS: NEGATIVE
HPV (WINDOPATH): NOT DETECTED
NEISSERIA GONORRHEA: NEGATIVE

## 2017-06-15 LAB — VITAMIN D 25 HYDROXY (VIT D DEFICIENCY, FRACTURES): Vit D, 25-Hydroxy: 11.9 ng/mL — ABNORMAL LOW (ref 30.0–100.0)

## 2017-06-17 LAB — CULTURE, URINE COMPREHENSIVE

## 2017-06-21 ENCOUNTER — Encounter: Payer: Self-pay | Admitting: Family Medicine

## 2017-06-22 ENCOUNTER — Encounter: Payer: Self-pay | Admitting: Family Medicine

## 2017-06-23 ENCOUNTER — Encounter: Payer: Self-pay | Admitting: Family Medicine

## 2017-06-23 MED ORDER — VITAMIN D (ERGOCALCIFEROL) 1.25 MG (50000 UNIT) PO CAPS: 50000.0000 [IU] | ORAL_CAPSULE | ORAL | 0 refills | Status: DC

## 2017-06-29 ENCOUNTER — Encounter: Payer: Medicaid Other | Admitting: Family Medicine

## 2017-07-11 ENCOUNTER — Ambulatory Visit (INDEPENDENT_AMBULATORY_CARE_PROVIDER_SITE_OTHER): Payer: Medicaid Other | Admitting: Obstetrics & Gynecology

## 2017-07-11 VITALS — BP 107/59 | HR 101 | Wt 186.0 lb

## 2017-07-11 DIAGNOSIS — Z3480 Encounter for supervision of other normal pregnancy, unspecified trimester: Secondary | ICD-10-CM

## 2017-07-11 DIAGNOSIS — Z3482 Encounter for supervision of other normal pregnancy, second trimester: Secondary | ICD-10-CM

## 2017-07-11 DIAGNOSIS — Z603 Acculturation difficulty: Secondary | ICD-10-CM

## 2017-07-11 DIAGNOSIS — Z789 Other specified health status: Secondary | ICD-10-CM | POA: Insufficient documentation

## 2017-07-11 DIAGNOSIS — N390 Urinary tract infection, site not specified: Secondary | ICD-10-CM

## 2017-07-11 DIAGNOSIS — Z23 Encounter for immunization: Secondary | ICD-10-CM

## 2017-07-11 DIAGNOSIS — Z98891 History of uterine scar from previous surgery: Secondary | ICD-10-CM

## 2017-07-11 DIAGNOSIS — O0931 Supervision of pregnancy with insufficient antenatal care, first trimester: Secondary | ICD-10-CM

## 2017-07-11 MED ORDER — PRENATAL VITAMINS 0.8 MG PO TABS: 1.0000 | ORAL_TABLET | Freq: Every day | ORAL | 12 refills | Status: AC

## 2017-07-11 MED ORDER — VITAMIN D (ERGOCALCIFEROL) 1.25 MG (50000 UNIT) PO CAPS: 50000.0000 [IU] | ORAL_CAPSULE | ORAL | 6 refills | Status: AC

## 2017-07-11 MED ORDER — PRENATAL VITAMINS 0.8 MG PO TABS
1.0000 | ORAL_TABLET | Freq: Every day | ORAL | 12 refills | Status: DC
Start: 2017-07-11 — End: 2017-07-11

## 2017-07-11 MED ORDER — VITAMIN D (ERGOCALCIFEROL) 1.25 MG (50000 UNIT) PO CAPS: 50000.0000 [IU] | ORAL_CAPSULE | ORAL | 6 refills | Status: DC

## 2017-07-11 MED ORDER — FERROUS SULFATE 325 (65 FE) MG PO TABS: 325.0000 mg | ORAL_TABLET | Freq: Two times a day (BID) | ORAL | 6 refills | Status: DC

## 2017-07-11 NOTE — Addendum Note (Signed)
Addended by: Lyndal Rainbow on: 07/11/2017 10:58 AM   Modules accepted: Orders

## 2017-07-11 NOTE — Addendum Note (Signed)
Addended by: Lyndal Rainbow on: 07/11/2017 10:48 AM   Modules accepted: Orders

## 2017-07-11 NOTE — Addendum Note (Signed)
Addended by: Lyndal Rainbow on: 07/11/2017 10:31 AM   Modules accepted: Orders

## 2017-07-11 NOTE — Progress Notes (Addendum)
   PRENATAL VISIT NOTE  Subjective:  Belinda Blake is a 36 y.o. G3P2002 at [redacted]w[redacted]d being seen today for ongoing prenatal care.  She is currently monitored for the following issues for this high-risk pregnancy and has No prenatal care in current pregnancy in first trimester; Supervision of other normal pregnancy, antepartum; History of cesarean section; and Frequent UTI on her problem list.  Patient reports difficulty breathing. .  Contractions: Not present. Vag. Bleeding: None.  Movement: Present. Denies leaking of fluid.   The following portions of the patient's history were reviewed and updated as appropriate: allergies, current medications, past family history, past medical history, past social history, past surgical history and problem list. Problem list updated.  Objective:   Vitals:   07/11/17 0950  BP: (!) 107/59  Pulse: (!) 101  Weight: 186 lb (84.4 kg)    Fetal Status: Fetal Heart Rate (bpm): 151   Movement: Present     General:  Alert, oriented and cooperative. Patient is in no acute distress.  Skin: Skin is warm and dry. No rash noted.   Cardiovascular: Normal heart rate noted  Respiratory: Normal respiratory effort, no problems with respiration noted; CTA; no increased WOB noted and no increased resp effort appreciated  Abdomen: Soft, gravid, appropriate for gestational age.  Pain/Pressure: Present     Pelvic: difficulty breathing mainly with walking.         Extremities: Normal range of motion.  Edema: None  Mental Status:  Normal mood and affect. Normal behavior. Normal judgment and thought content.   Assessment and Plan:  Pregnancy: G3P2002 at [redacted]w[redacted]d  1. Supervision of other normal pregnancy, antepartum  - AFP, Serum, Open Spina Bifida  2. History of cesarean section Pt considering TOLAC. Rec a formal conversation with pt and interpreter. Needs a formal discussion with Arabic interpreter to discuss. Pt offered to bring her friend for the discussion I have informed  her that this will need to be with a Psychologist, sport and exercise.    3. Frequent UTI  4. Needs flu shot  - Flu Vaccine QUAD 36+ mos IM  Preterm labor symptoms and general obstetric precautions including but not limited to vaginal bleeding, contractions, leaking of fluid and fetal movement were reviewed in detail with the patient. Please refer to After Visit Summary for other counseling recommendations.  Return in about 4 weeks (around 08/08/2017).   Lavonia Drafts, MD

## 2017-07-11 NOTE — Progress Notes (Signed)
Pt states that she has a hard time breathing for the past month.

## 2017-07-11 NOTE — Patient Instructions (Signed)

## 2017-07-13 LAB — AFP, SERUM, OPEN SPINA BIFIDA
AFP MoM: 1.27
AFP Value: 43.6 ng/mL
Gest. Age on Collection Date: 17.4 weeks
MATERNAL AGE AT EDD: 38.7 a
OSBR Risk 1 IN: 5251
TEST RESULTS AFP: NEGATIVE
Weight: 186 [lb_av]

## 2017-07-20 ENCOUNTER — Ambulatory Visit (HOSPITAL_COMMUNITY)
Admission: RE | Admit: 2017-07-20 | Discharge: 2017-07-20 | Disposition: A | Discharge status: Home / Self Care | Payer: Medicaid Other | Source: Ambulatory Visit | Attending: Family Medicine | Admitting: Family Medicine

## 2017-07-20 ENCOUNTER — Other Ambulatory Visit: Payer: Self-pay | Admitting: Family Medicine

## 2017-07-20 DIAGNOSIS — O34219 Maternal care for unspecified type scar from previous cesarean delivery: Secondary | ICD-10-CM | POA: Diagnosis not present

## 2017-07-20 DIAGNOSIS — O09522 Supervision of elderly multigravida, second trimester: Secondary | ICD-10-CM | POA: Insufficient documentation

## 2017-07-20 DIAGNOSIS — Z3A18 18 weeks gestation of pregnancy: Secondary | ICD-10-CM | POA: Insufficient documentation

## 2017-07-20 DIAGNOSIS — O3412 Maternal care for benign tumor of corpus uteri, second trimester: Secondary | ICD-10-CM | POA: Insufficient documentation

## 2017-07-20 DIAGNOSIS — D251 Intramural leiomyoma of uterus: Secondary | ICD-10-CM | POA: Insufficient documentation

## 2017-07-20 DIAGNOSIS — Z3689 Encounter for other specified antenatal screening: Secondary | ICD-10-CM | POA: Insufficient documentation

## 2017-07-20 DIAGNOSIS — Z3480 Encounter for supervision of other normal pregnancy, unspecified trimester: Secondary | ICD-10-CM

## 2017-08-01 ENCOUNTER — Encounter: Payer: Self-pay | Admitting: Obstetrics & Gynecology

## 2017-08-02 ENCOUNTER — Ambulatory Visit (INDEPENDENT_AMBULATORY_CARE_PROVIDER_SITE_OTHER): Payer: Medicaid Other | Admitting: Advanced Practice Midwife

## 2017-08-02 ENCOUNTER — Encounter: Payer: Self-pay | Admitting: Advanced Practice Midwife

## 2017-08-02 VITALS — BP 121/60 | HR 102 | Wt 196.0 lb

## 2017-08-02 DIAGNOSIS — R350 Frequency of micturition: Secondary | ICD-10-CM

## 2017-08-02 DIAGNOSIS — Z3480 Encounter for supervision of other normal pregnancy, unspecified trimester: Secondary | ICD-10-CM

## 2017-08-02 DIAGNOSIS — Z3482 Encounter for supervision of other normal pregnancy, second trimester: Secondary | ICD-10-CM

## 2017-08-02 DIAGNOSIS — Z98891 History of uterine scar from previous surgery: Secondary | ICD-10-CM

## 2017-08-02 LAB — POCT URINALYSIS DIPSTICK
BILIRUBIN UA: NEGATIVE
GLUCOSE UA: NEGATIVE
Ketones, UA: NEGATIVE
Leukocytes, UA: NEGATIVE
NITRITE UA: NEGATIVE
Protein, UA: NEGATIVE
RBC UA: NEGATIVE
Spec Grav, UA: 1.015 (ref 1.010–1.025)
UROBILINOGEN UA: 0.2 U/dL

## 2017-08-02 NOTE — Patient Instructions (Addendum)
[facility] [place] [address] [place], [place]  [address] Phone - [phone]   Fax - [phone]  [facility] [address] [place], [place] [address] Phone - [phone]   Fax - [place] [address], [place]  [address] Phone - [phone]   Fax - [phone]  [place] [place] [address] [place], [place]  [address] Phone - [phone]   Fax - [phone]  [place] [address] [place], [place]  [address] Phone - [phone]   Fax - [phone]  [facility] [address] [place], [place]  [address] Phone - [phone]   Fax - [place] [address], [place], [place]  [address] Phone - [phone]   Fax - [phone]  [place] [address] [place], [place] [address] [place], [place]  [address] Phone - [phone]   Fax - [place] [address] [place], [place]  [address] Phone - [phone]   Fax - [phone] [facility] [place]. [address] [place], [place]  [address] Phone - [phone]   Fax - [phone]  [facility] [address] [place], [place]  [address] Phone - [phone]   Fax - [phone]  [facility] [place], [place], [place]  [address] Phone - [phone]   Fax - [place] [address], [place], [place]  [address] Phone - [phone]   Fax - [phone]  [facility] [address], [place], [place]  [address] Phone - [place], [place]  [address] Phone - [phone]   Fax - [place] [address] [place], [place]  [address] Phone - [phone]   Fax - [phone]  [place] [address] [place], [place]  [address] Phone - [phone]   Fax - [place]. [place], [place]  [address] Phone - [phone]   Fax - [place], [place], [place]  [address] Phone - [phone]   Fax - [place] [address], [place], [place]  [address] Phone - [phone]   Fax - [phone]  [facility] [place] [address], [place], [place]  [address] Phone - [phone]   Fax - [place], [place], [place]  [address] Phone - [phone]   Fax - [phone]  [place] [address] [place], [place]  [address] Phone - [phone]   Fax - [phone] [place] [address] [place], [place]  [address] Phone - [phone]   Fax - [place] [address] [place], [place]  [address] Phone - [phone]   Fax - [place] [address] [place], [place]  [address] Phone - [phone]   Fax - [phone]  [place] [address] [place] [address] Phone [phone]  Fax [phone]    Insomnia Insomnia is a sleep disorder that makes it difficult to fall asleep  or to stay asleep. Insomnia can cause tiredness (fatigue), low energy, difficulty concentrating, mood swings, and poor performance at work or school. There are three different ways to classify insomnia:  Difficulty falling asleep.  Difficulty staying asleep.  Waking up too early in the morning.  Any type of insomnia can be long-term (chronic) or short-term (acute). Both are common. Short-term insomnia  usually lasts for three months or less. Chronic insomnia occurs at least three times a week for longer than three months. What are the causes? Insomnia may be caused by another condition, situation, or substance, such as:  Anxiety.  Certain medicines.  Gastroesophageal reflux disease (GERD) or other gastrointestinal conditions.  Asthma or other breathing conditions.  Restless legs syndrome, sleep apnea, or other sleep disorders.  Chronic pain.  Menopause. This may include hot flashes.  Stroke.  Abuse of alcohol, tobacco, or illegal drugs.  Depression.  Caffeine.  Neurological disorders, such as Alzheimer disease.  An overactive thyroid (hyperthyroidism).  The cause of insomnia may not be known. What increases the risk? Risk factors for insomnia include:  Gender. Women are more commonly affected than men.  Age. Insomnia is more common as you get older.  Stress. This may involve your professional or personal life.  Income. Insomnia is more common in people with lower income.  Lack of exercise.  Irregular work schedule or night shifts.  Traveling between different time zones.  What are the signs or symptoms? If you have insomnia, trouble falling asleep or trouble staying asleep is the main symptom. This may lead to other symptoms, such as:  Feeling fatigued.  Feeling nervous about going to sleep.  Not feeling rested in the morning.  Having trouble concentrating.  Feeling irritable, anxious, or depressed.  How is this treated? Treatment for insomnia depends on the cause. If your insomnia is caused by an underlying condition, treatment will focus on addressing the condition. Treatment may also include:  Medicines to help you sleep.  Counseling or therapy.  Lifestyle adjustments.  Follow these instructions at home:  Take medicines only as directed by your health care provider.  Keep regular sleeping and waking hours. Avoid naps.  Keep a sleep  diary to help you and your health care provider figure out what could be causing your insomnia. Include: ? When you sleep. ? When you wake up during the night. ? How well you sleep. ? How rested you feel the next day. ? Any side effects of medicines you are taking. ? What you eat and drink.  Make your bedroom a comfortable place where it is easy to fall asleep: ? Put up shades or special blackout curtains to block light from outside. ? Use a white noise machine to block noise. ? Keep the temperature cool.  Exercise regularly as directed by your health care provider. Avoid exercising right before bedtime.  Use relaxation techniques to manage stress. Ask your health care provider to suggest some techniques that may work well for you. These may include: ? Breathing exercises. ? Routines to release muscle tension. ? Visualizing peaceful scenes.  Cut back on alcohol, caffeinated beverages, and cigarettes, especially close to bedtime. These can disrupt your sleep.  Do not overeat or eat spicy foods right before bedtime. This can lead to digestive discomfort that can make it hard for you to sleep.  Limit screen use before bedtime. This includes: ? Watching TV. ? Using your smartphone, tablet, and computer.  Stick to a routine. This can help you fall asleep  faster. Try to do a quiet activity, brush your teeth, and go to bed at the same time each night.  Get out of bed if you are still awake after 15 minutes of trying to sleep. Keep the lights down, but try reading or doing a quiet activity. When you feel sleepy, go back to bed.  Make sure that you drive carefully. Avoid driving if you feel very sleepy.  Keep all follow-up appointments as directed by your health care provider. This is important. Contact a health care provider if:  You are tired throughout the day or have trouble in your daily routine due to sleepiness.  You continue to have sleep problems or your sleep problems get  worse. Get help right away if:  You have serious thoughts about hurting yourself or someone else. This information is not intended to replace advice given to you by your health care provider. Make sure you discuss any questions you have with your health care provider. Document Released: 09/10/2000 Document Revised: 02/13/2016 Document Reviewed: 06/14/2014 Elsevier Interactive Patient Education  [employer].

## 2017-08-02 NOTE — Progress Notes (Signed)
Patient has felt dizzy and nauseated in the past three days. Patient also complaining urinary frequency.[place] BSN

## 2017-08-02 NOTE — Addendum Note (Signed)
Addended by: Phill Myron on: 08/02/2017 05:10 PM   Modules accepted: Orders

## 2017-08-02 NOTE — Progress Notes (Signed)
PRENATAL VISIT NOTE  Subjective:  Belinda Blake is a 36 y.o. G3P2002 at [redacted]w[redacted]d being seen today for ongoing prenatal care.  She is currently monitored for the following issues for this high-risk pregnancy and has No prenatal care in current pregnancy in first trimester; Supervision of other normal pregnancy, antepartum; History of cesarean section; Frequent UTI; and Language barrier affecting health care on their problem list.  Patient reports dizziness and urinary frequency x 3 days, occasional constant low back pain at night. Very rare contractions not associated w/ LBP. Also C/O insomnia.  Contractions: Not present. Vag. Bleeding: None.  Movement: Present. Denies leaking of fluid.   Taking Iron BID as directed. Vomits ~ once per day. Able to eat and drink well. Denies dysuria, urgency, hematuria, flank pain, fever, chills.  Live interpreter present to assist w/ C/S vs TOLAC consent. Reviewed C/S Hx. Both performed in [place].  G1:  "4 1/4 Kilos" per pt. She states she labored, but didn't get past two cm. Baby's cord was around his neck. No sure if C/S was performed more for fetal indications or arrest of dilation. She did say that the doctor was worried about a big baby.  G2: "4 1/4 Kilos" per pt. (was 8-8 in chart). Elective repeat C/S.   The following portions of the patient's history were reviewed and updated as appropriate: allergies, current medications, past family history, past medical history, past social history, past surgical history and problem list. Problem list updated.  Objective:   Vitals:   08/02/17 0912  BP: 121/60  Pulse: (!) 102  Weight: 196 lb (88.9 kg)    Fetal Status: Fetal Heart Rate (bpm): 144 Fundal Height: 21 cm Movement: Present     General:  Alert, oriented and cooperative. Patient is in no acute distress.  Skin: Skin is warm and dry. No rash noted.   Cardiovascular: Normal heart rate noted  Respiratory: Normal respiratory effort, no problems with  respiration noted  Abdomen: Soft, gravid, appropriate for gestational age.  Pain/Pressure: Present     Pelvic: Cervical exam declined        Back No CVAT. Normal ROM  Extremities: Normal range of motion.  Edema: None  Mental Status:  Normal mood and affect. Normal behavior. Normal judgment and thought content.   Results for orders placed or performed in visit on 08/02/17 (from the past 24 hour(s))  POCT Urinalysis Dipstick     Status: Normal   Collection Time: 08/02/17  9:33 AM  Result Value Ref Range   Color, UA yellow    Clarity, UA clear    Glucose, UA neg    Bilirubin, UA neg    Ketones, UA neg    Spec Grav, UA 1.015 1.010 - 1.025   Blood, UA neg    pH, UA  5.0 - 8.0   Protein, UA neg    Urobilinogen, UA 0.2 0.2 or 1.0 E.U./dL   Nitrite, UA neg    Leukocytes, UA Negative Negative    Assessment and Plan:  Pregnancy: G3P2002 at [redacted]w[redacted]d  1. Supervision of other normal pregnancy, antepartum   2. Dizziness  - CBC - CULTURE, URINE COMPREHENSIVE - Increase fluids and iron  3. Urinary frequency  - Urine Culture - POCT Urinalysis Dipstick   4. Previous C/S  - Lengthy discussion of R/B TOLAC after 2 Cesareans with profession interpreter at [facility]. Strongly desires TOLAC because of the challenge of recovering from abd surgery with little support at home. Discussed increased risk of uterine  rupture risk of 1.59% after 2 Cesareans. Informed her that as a practice we will allow her to labor spontaneously, but will not induce or augment her labor. Recommended scheduling C/S at 39 weeks. Pt may want to wait another week. Also reminded her that she can change her mind at any time. TOLAC consent signed.  5. Insomnia  - Handout given and discussed. Would like to avoid meds.   Preterm labor symptoms and general obstetric precautions including but not limited to vaginal bleeding, contractions, leaking of fluid and fetal movement were reviewed in detail with the patient. Please refer to  After Visit Summary for other counseling recommendations.  Return in about 4 weeks (around 08/30/2017) for ROB.   Belinda Blake, CNM

## 2017-08-05 LAB — CULTURE, URINE COMPREHENSIVE

## 2017-08-06 LAB — CBC
HEMOGLOBIN: 10.4 g/dL — AB (ref 11.1–15.9)
Hematocrit: 32.5 % — ABNORMAL LOW (ref 34.0–46.6)
MCH: 28.4 pg (ref 26.6–33.0)
MCHC: 32 g/dL (ref 31.5–35.7)
MCV: 89 fL (ref 79–97)
Platelets: 282 10*3/uL (ref 150–379)
RBC: 3.66 x10E6/uL — AB (ref 3.77–5.28)
RDW: 14.7 % (ref 12.3–15.4)
WBC: 6.9 10*3/uL (ref 3.4–10.8)

## 2017-08-08 ENCOUNTER — Encounter: Payer: Medicaid Other | Admitting: Obstetrics & Gynecology

## 2017-08-15 ENCOUNTER — Encounter: Payer: Self-pay | Admitting: Obstetrics & Gynecology

## 2017-08-15 ENCOUNTER — Telehealth: Payer: Self-pay

## 2017-08-15 NOTE — Telephone Encounter (Signed)
Patient called and states she just feels like she has the flu. Patient instructed for [place] to give her any tamiflu she will need an official diagnosis.  Patient states that she would not take any medication if we gave it to her. So then patient asks if its ok she has the flu. Explained that she needs to stay well hydrated. Patient states understanding. Kathrene Alu RNBSN

## 2017-08-15 NOTE — Telephone Encounter (Signed)
Tamiflu would be indicated for treatment if she has a confirmed Dx of influenza A or B. Dose would be 75mg  BID x 5 days. I don't see any lab testing in our records or in Care Everywhere. Can you call the patient and see where she had testing done?

## 2017-08-30 ENCOUNTER — Ambulatory Visit (INDEPENDENT_AMBULATORY_CARE_PROVIDER_SITE_OTHER): Payer: Medicaid Other | Admitting: Advanced Practice Midwife

## 2017-08-30 ENCOUNTER — Encounter: Payer: Self-pay | Admitting: Advanced Practice Midwife

## 2017-08-30 VITALS — BP 98/54 | HR 111 | Wt 195.0 lb

## 2017-08-30 DIAGNOSIS — Z3480 Encounter for supervision of other normal pregnancy, unspecified trimester: Secondary | ICD-10-CM

## 2017-08-30 DIAGNOSIS — Z3482 Encounter for supervision of other normal pregnancy, second trimester: Secondary | ICD-10-CM

## 2017-08-30 NOTE — Patient Instructions (Signed)
Glucose Tolerance Test During Pregnancy The glucose tolerance test (GTT) is a blood test used to determine if you have developed a type of diabetes during pregnancy (gestational diabetes). This is when your body does not properly process sugar (glucose) in the food you eat, resulting in high blood glucose levels. Typically, a GTT is done after you have had a 1-hour glucose test with results that indicate you possibly have gestational diabetes. It may also be done if:  You have a history of giving birth to very large babies or have experienced repeated fetal loss (stillbirth).  You have signs and symptoms of diabetes, such as: ? Changes in your vision. ? Tingling or numbness in your hands or feet. ? Changes in hunger, thirst, and urination not otherwise explained by your pregnancy.  The GTT lasts about 3 hours. You will be given a sugar-water solution to drink at the beginning of the test. You will have blood drawn before you drink the solution and then again 1, 2, and 3 hours after you drink it. You will not be allowed to eat or drink anything else during the test. You must remain at the testing location to make sure that your blood is drawn on time. You should also avoid exercising during the test, because exercise can alter test results. How do I prepare for this test? Eat normally for 3 days prior to the GTT test, including having plenty of carbohydrate-rich foods. Do not eat or drink anything except water during the final 12 hours before the test. In addition, your health care provider may ask you to stop taking certain medicines before the test. What do the results mean? It is your responsibility to obtain your test results. Ask the lab or department performing the test when and how you will get your results. Contact your health care provider to discuss any questions you have about your results. Range of Normal Values Ranges for normal values may vary among different labs and hospitals. You  should always check with your health care provider after having lab work or other tests done to discuss whether your values are considered within normal limits. Normal levels of blood glucose are as follows:  Fasting: less than 105 mg/dL.  1 hour after drinking the solution: less than 190 mg/dL.  2 hours after drinking the solution: less than 165 mg/dL.  3 hours after drinking the solution: less than 145 mg/dL.  Some substances can interfere with GTT results. These may include:  Blood pressure and heart failure medicines, including beta blockers, furosemide, and thiazides.  Anti-inflammatory medicines, including aspirin.  Nicotine.  Some psychiatric medicines.  Meaning of Results Outside Normal Value Ranges GTT test results that are above normal values may indicate a number of health problems, such as:  Gestational diabetes.  Acute stress response.  Cushing syndrome.  Tumors such as pheochromocytoma or glucagonoma.  Long-term kidney problems.  Pancreatitis.  Hyperthyroidism.  Current infection.  Discuss your test results with your health care provider. He or she will use the results to make a diagnosis and determine a treatment plan that is right for you. This information is not intended to replace advice given to you by your health care provider. Make sure you discuss any questions you have with your health care provider. Document Released: 03/14/2012 Document Revised: 02/19/2016 Document Reviewed: 01/18/2014 Elsevier Interactive Patient Education  2017 [employer].

## 2017-08-30 NOTE — Progress Notes (Signed)
   PRENATAL VISIT NOTE  Subjective:  Belinda Blake is a 36 y.o. G3P2002 at [redacted]w[redacted]d being seen today for ongoing prenatal care.  She is currently monitored for the following issues for this high-risk pregnancy and has Supervision of other normal pregnancy, antepartum; History of cesarean section; Frequent UTI; and Language barrier affecting health care on their problem list.  Patient reports no complaints.  Contractions: Not present. Vag. Bleeding: None.  Movement: Present. Denies leaking of fluid.   The following portions of the patient's history were reviewed and updated as appropriate: allergies, current medications, past family history, past medical history, past social history, past surgical history and problem list. Problem list updated.  Objective:   Vitals:   08/30/17 0921  BP: (!) 98/54  Pulse: (!) 111  Weight: 195 lb (88.5 kg)    Fetal Status: Fetal Heart Rate (bpm): 158   Movement: Present     General:  Alert, oriented and cooperative. Patient is in no acute distress.  Skin: Skin is warm and dry. No rash noted.   Cardiovascular: Normal heart rate noted  Respiratory: Normal respiratory effort, no problems with respiration noted  Abdomen: Soft, gravid, appropriate for gestational age.  Pain/Pressure: Present     Pelvic: Cervical exam deferred        Extremities: Normal range of motion.  Edema: None  Mental Status:  Normal mood and affect. Normal behavior. Normal judgment and thought content.   Assessment and Plan:  Pregnancy: G3P2002 at [redacted]w[redacted]d  1. Supervision of other normal pregnancy, antepartum     Plan glucola next visit     Discussed signs to report  Preterm labor symptoms and general obstetric precautions including but not limited to vaginal bleeding, contractions, leaking of fluid and fetal movement were reviewed in detail with the patient. Please refer to After Visit Summary for other counseling recommendations.  Return in about 4 weeks (around 09/27/2017) for [employer].   Hansel Feinstein, CNM

## 2017-09-29 ENCOUNTER — Ambulatory Visit (INDEPENDENT_AMBULATORY_CARE_PROVIDER_SITE_OTHER): Payer: Medicaid Other | Admitting: Family Medicine

## 2017-09-29 VITALS — BP 97/42 | HR 80 | Wt 201.0 lb

## 2017-09-29 DIAGNOSIS — Z98891 History of uterine scar from previous surgery: Secondary | ICD-10-CM

## 2017-09-29 DIAGNOSIS — F409 Phobic anxiety disorder, unspecified: Secondary | ICD-10-CM

## 2017-09-29 DIAGNOSIS — F5105 Insomnia due to other mental disorder: Secondary | ICD-10-CM

## 2017-09-29 DIAGNOSIS — Z23 Encounter for immunization: Secondary | ICD-10-CM | POA: Diagnosis not present

## 2017-09-29 DIAGNOSIS — O09529 Supervision of elderly multigravida, unspecified trimester: Secondary | ICD-10-CM

## 2017-09-29 DIAGNOSIS — Z3480 Encounter for supervision of other normal pregnancy, unspecified trimester: Secondary | ICD-10-CM

## 2017-09-29 DIAGNOSIS — Z789 Other specified health status: Secondary | ICD-10-CM

## 2017-09-29 DIAGNOSIS — O09523 Supervision of elderly multigravida, third trimester: Secondary | ICD-10-CM

## 2017-09-29 DIAGNOSIS — Z3483 Encounter for supervision of other normal pregnancy, third trimester: Secondary | ICD-10-CM

## 2017-09-29 LAB — POCT URINALYSIS DIPSTICK
Bilirubin, UA: NEGATIVE
Glucose, UA: NEGATIVE
KETONES UA: NEGATIVE
Leukocytes, UA: NEGATIVE
Nitrite, UA: NEGATIVE
SPEC GRAV UA: 1.015 (ref 1.010–1.025)
Urobilinogen, UA: 0.2 E.U./dL
pH, UA: 6.5 (ref 5.0–8.0)

## 2017-09-29 MED ORDER — DOXYLAMINE SUCCINATE (SLEEP) 25 MG PO TABS: 25.0000 mg | ORAL_TABLET | Freq: Every day | ORAL | 2 refills | Status: DC

## 2017-09-29 NOTE — Progress Notes (Signed)
   PRENATAL VISIT NOTE  Subjective:  Belinda Blake is a 37 y.o. G3P2002 at [redacted]w[redacted]d being seen today for ongoing prenatal care.  She is currently monitored for the following issues for this high-risk pregnancy and has Supervision of other normal pregnancy, antepartum; History of cesarean section; Frequent UTI; Language barrier affecting health care; and AMA (advanced maternal age) multigravida 35+, unspecified trimester on their problem list.  Patient reports fatigue and insomnia.  Contractions: Not present. Vag. Bleeding: None.  Movement: Present. Denies leaking of fluid.   The following portions of the patient's history were reviewed and updated as appropriate: allergies, current medications, past family history, past medical history, past social history, past surgical history and problem list. Problem list updated.  Objective:   Vitals:   09/29/17 0831  BP: (!) 97/42  Pulse: 80  Weight: 201 lb (91.2 kg)    Fetal Status: Fetal Heart Rate (bpm): 145 Fundal Height: 28 cm Movement: Present     General:  Alert, oriented and cooperative. Patient is in no acute distress.  Skin: Skin is warm and dry. No rash noted.   Cardiovascular: Normal heart rate noted  Respiratory: Normal respiratory effort, no problems with respiration noted  Abdomen: Soft, gravid, appropriate for gestational age.  Pain/Pressure: Present     Pelvic: Cervical exam deferred        Extremities: Normal range of motion.     Mental Status:  Normal mood and affect. Normal behavior. Normal judgment and thought content.   Assessment and Plan:  Pregnancy: G3P2002 at [redacted]w[redacted]d  1. Supervision of other normal pregnancy, antepartum FHT and FH normal. Doxylamnie for sleep. 28 week labs today - Glucose Tolerance, 2 Hours w/1 Hour - CBC - HIV antibody - RPR  2. Language barrier affecting health care Interpreter present   3. History of cesarean section Wishes TOLAC - previously counseled  4. AMA (advanced maternal age)  multigravida 84+, unspecified trimester NIPS low risk. No antenatal testing warranted.  Preterm labor symptoms and general obstetric precautions including but not limited to vaginal bleeding, contractions, leaking of fluid and fetal movement were reviewed in detail with the patient. Please refer to After Visit Summary for other counseling recommendations.  Return in about 2 weeks (around 10/13/2017) for OB f/u.   Truett Mainland, DO

## 2017-09-29 NOTE — Addendum Note (Signed)
Addended by: Phill Myron on: 09/29/2017 11:41 AM   Modules accepted: Orders

## 2017-09-29 NOTE — Addendum Note (Signed)
Addended by: Lewie Loron D on: 09/29/2017 09:56 AM   Modules accepted: Orders

## 2017-09-29 NOTE — Progress Notes (Signed)
Pt states she is having pain in her upper thigh area, hard to walk.

## 2017-09-30 ENCOUNTER — Other Ambulatory Visit: Payer: Self-pay | Admitting: Family Medicine

## 2017-09-30 ENCOUNTER — Telehealth: Payer: Self-pay

## 2017-09-30 DIAGNOSIS — O24419 Gestational diabetes mellitus in pregnancy, unspecified control: Secondary | ICD-10-CM

## 2017-09-30 DIAGNOSIS — R7309 Other abnormal glucose: Secondary | ICD-10-CM

## 2017-09-30 HISTORY — DX: Gestational diabetes mellitus in pregnancy, unspecified control: O24.419

## 2017-09-30 LAB — GLUCOSE TOLERANCE, 2 HOURS W/ 1HR
Glucose, 1 hour: 165 mg/dL (ref 65–179)
Glucose, 2 hour: 158 mg/dL — ABNORMAL HIGH (ref 65–152)
Glucose, Fasting: 77 mg/dL (ref 65–91)

## 2017-09-30 LAB — CBC
HEMOGLOBIN: 10.7 g/dL — AB (ref 11.1–15.9)
Hematocrit: 33.4 % — ABNORMAL LOW (ref 34.0–46.6)
MCH: 28.2 pg (ref 26.6–33.0)
MCHC: 32 g/dL (ref 31.5–35.7)
MCV: 88 fL (ref 79–97)
Platelets: 264 10*3/uL (ref 150–379)
RBC: 3.8 x10E6/uL (ref 3.77–5.28)
RDW: 14.3 % (ref 12.3–15.4)
WBC: 7.3 10*3/uL (ref 3.4–10.8)

## 2017-09-30 LAB — RPR: RPR Ser Ql: NONREACTIVE

## 2017-09-30 LAB — HIV ANTIBODY (ROUTINE TESTING W REFLEX): HIV Screen 4th Generation wRfx: NONREACTIVE

## 2017-09-30 MED ORDER — ACCU-CHEK FASTCLIX LANCETS MISC
1.0000 [IU] | Freq: Four times a day (QID) | 12 refills | Status: DC
Start: 2017-09-30 — End: 2017-12-12

## 2017-09-30 MED ORDER — GLUCOSE BLOOD VI STRP: ORAL_STRIP | 12 refills | Status: DC

## 2017-09-30 MED ORDER — ACCU-CHEK NANO SMARTVIEW W/DEVICE KIT: 1.0000 | PACK | 0 refills | Status: DC

## 2017-09-30 MED ORDER — FERROUS SULFATE 325 (65 FE) MG PO TABS: 325.0000 mg | ORAL_TABLET | Freq: Three times a day (TID) | ORAL | 6 refills | Status: AC

## 2017-09-30 NOTE — Telephone Encounter (Signed)
-----   Message from Truett Mainland, DO sent at 09/30/2017  8:32 AM EST ----- Iron increased to TID. Needs referral to diabetes counseling

## 2017-09-30 NOTE — Telephone Encounter (Signed)
Patient called and made aware that iron was still low and Dr. Nehemiah Settle would like for her to take iron three times a day.   Patient also made aware that she has gestational diabetes because she failed her 2 hr glucose testing. Patient made aware that we have placed a referral for diabetes education and they will begin by teaching her how to check her blood sugars. Patient made aware that if they dont call her with appointment by middle of next week to give [place] a call back. Patient states understanding. Kathrene Alu RNBSN

## 2017-10-01 LAB — CULTURE, OB URINE

## 2017-10-01 LAB — URINE CULTURE, OB REFLEX

## 2017-10-02 ENCOUNTER — Encounter (HOSPITAL_COMMUNITY): Payer: Self-pay | Admitting: *Deleted

## 2017-10-02 ENCOUNTER — Inpatient Hospital Stay (HOSPITAL_COMMUNITY)
Admission: AD | Admit: 2017-10-02 | Discharge: 2017-10-02 | Disposition: A | Discharge status: Home / Self Care | Payer: Medicaid Other | Source: Ambulatory Visit | Attending: Obstetrics & Gynecology | Admitting: Obstetrics & Gynecology

## 2017-10-02 DIAGNOSIS — R109 Unspecified abdominal pain: Secondary | ICD-10-CM | POA: Diagnosis present

## 2017-10-02 DIAGNOSIS — Z87891 Personal history of nicotine dependence: Secondary | ICD-10-CM | POA: Diagnosis not present

## 2017-10-02 DIAGNOSIS — M545 Low back pain: Secondary | ICD-10-CM | POA: Diagnosis present

## 2017-10-02 DIAGNOSIS — Z3A29 29 weeks gestation of pregnancy: Secondary | ICD-10-CM

## 2017-10-02 DIAGNOSIS — Z3689 Encounter for other specified antenatal screening: Secondary | ICD-10-CM

## 2017-10-02 DIAGNOSIS — O26893 Other specified pregnancy related conditions, third trimester: Secondary | ICD-10-CM | POA: Diagnosis present

## 2017-10-02 DIAGNOSIS — Z789 Other specified health status: Secondary | ICD-10-CM

## 2017-10-02 DIAGNOSIS — O09523 Supervision of elderly multigravida, third trimester: Secondary | ICD-10-CM | POA: Diagnosis not present

## 2017-10-02 DIAGNOSIS — O9989 Other specified diseases and conditions complicating pregnancy, childbirth and the puerperium: Secondary | ICD-10-CM

## 2017-10-02 DIAGNOSIS — M549 Dorsalgia, unspecified: Secondary | ICD-10-CM

## 2017-10-02 DIAGNOSIS — O98913 Unspecified maternal infectious and parasitic disease complicating pregnancy, third trimester: Secondary | ICD-10-CM | POA: Diagnosis not present

## 2017-10-02 LAB — WET PREP, GENITAL
Sperm: NONE SEEN
TRICH WET PREP: NONE SEEN
YEAST WET PREP: NONE SEEN

## 2017-10-02 LAB — URINALYSIS, ROUTINE W REFLEX MICROSCOPIC
Bilirubin Urine: NEGATIVE
GLUCOSE, UA: NEGATIVE mg/dL
Hgb urine dipstick: NEGATIVE
Ketones, ur: 5 mg/dL — AB
LEUKOCYTES UA: NEGATIVE
NITRITE: NEGATIVE
PROTEIN: NEGATIVE mg/dL
Specific Gravity, Urine: 1.002 — ABNORMAL LOW (ref 1.005–1.030)
pH: 7 (ref 5.0–8.0)

## 2017-10-02 MED ORDER — CYCLOBENZAPRINE HCL 10 MG PO TABS
10.0000 mg | ORAL_TABLET | Freq: Once | ORAL | Status: AC
  Administered 2017-10-02: 10 mg via ORAL
  Filled 2017-10-02: qty 1

## 2017-10-02 MED ORDER — ACETAMINOPHEN 500 MG PO TABS
1000.0000 mg | ORAL_TABLET | Freq: Once | ORAL | Status: AC
  Administered 2017-10-02: 1000 mg via ORAL
  Filled 2017-10-02: qty 2

## 2017-10-02 MED ORDER — CYCLOBENZAPRINE HCL 10 MG PO TABS: 10.0000 mg | ORAL_TABLET | Freq: Three times a day (TID) | ORAL | 0 refills | Status: DC | PRN

## 2017-10-02 NOTE — MAU Provider Note (Signed)
History  CSN: [id number] Arrival date and time: 10/02/17 1856  None     Chief Complaint  Patient presents with  . Abdominal Pain  . Back Pain    HPI: Belinda Blake is a 37 y.o. [id number] with IUP at 52w2dwho presents to maternity admissions reporting back and waist pain since yesterday. She points to pain on her left lower back and side, pain worse with movement. She has not taken anything for this at home. Denies any other associated symptoms. Denies any trauma. Also reports urinary frequency, but no dysuria or hematuria. Also denies vaginal bleeding, vaginal discharge, LOF or contractions. Reports good fetal movement  [facility] Pregnancy complicated by hx of C/S, AMA, frequent UTIs, and GDM.   OB History  Gravida Para Term Preterm AB Living  _0 0 0 2  SAB TAB Ectopic Multiple Live Births          2    # Outcome Date GA Lbr Len/2nd Weight Sex Delivery Anes PTL Lv  3 Current           2 Term 2009 [id number] lb 4.2 oz (4.2 kg) M CS-Unspec  N LIV  1 Term 2005   9 lb 3 oz (4.167 kg) M CS-Unspec  N LIV     Complications: Fetal Intolerance,Failure to Progress in First Stage,Nuchal cord affecting delivery    Obstetric Comments  #1 nuchal cord, baby did not tolerate labor  #2 scheduled repeat c/s   Past Medical History:  Diagnosis Date  . HPV in female   . Infection    UTI   Past Surgical History:  Procedure Laterality Date  . CESAREAN SECTION     Family History  Problem Relation Age of Onset  . Diabetes Father   . Asthma Neg Hx   . Cancer Neg Hx   . Hearing loss Neg Hx   . Hypertension Neg Hx   . Stroke Neg Hx    Social History   Socioeconomic History  . Marital status: Married    Spouse name: Not on file  . Number of children: Not on file  . Years of education: Not on file  . Highest education level: Not on file  Social Needs  . Financial resource strain: Not on file  . Food insecurity - worry: Not on file  . Food insecurity - inability: Not  on file  . Transportation needs - medical: Not on file  . Transportation needs - non-medical: Not on file  Occupational History  . Not on file  Tobacco Use  . Smoking status: Former SmResearch scientist (life sciences). Smokeless tobacco: Never Used  . Tobacco comment: occ smoker, social not with preg  Substance and Sexual Activity  . Alcohol use: No  . Drug use: No  . Sexual activity: Yes  Other Topics Concern  . Not on file  Social History Narrative  . Not on file   No Known Allergies  Medications Prior to Admission  Medication Sig Dispense Refill Last Dose  . doxylamine, Sleep, (UNISOM) 25 MG tablet Take 1 tablet (25 mg total) by mouth at bedtime. 30 tablet 2 Past Week at Unknown time  . ferrous sulfate (FERROUSUL) 325 (65 FE) MG tablet Take 1 tablet (325 mg total) by mouth 3 (three) times daily with meals. 90 tablet 6 10/02/2017 at Unknown time  . Prenatal Multivit-Min-Fe-FA (PRENATAL VITAMINS) 0.8 MG tablet Take 1 tablet by mouth daily. 30 tablet 12 10/02/2017 at Unknown time  .  Vitamin D, Ergocalciferol, (DRISDOL) 50000 units CAPS capsule Take 1 capsule (50,000 Units total) by mouth every 7 (seven) days. 12 capsule 6 10/01/2017 at Unknown time  . ACCU-CHEK FASTCLIX LANCETS MISC 1 Units by Percutaneous route 4 (four) times daily. 100 each 12   . Blood Glucose Monitoring Suppl (ACCU-CHEK NANO SMARTVIEW) w/Device KIT 1 kit by Subdermal route as directed. Check blood sugars for fasting, and two hours after breakfast, lunch and dinner (4 checks daily) 1 kit 0   . cyclobenzaprine (FLEXERIL) 5 MG tablet Take 1 tablet (5 mg total) by mouth 2 (two) times daily as needed for muscle spasms. (Patient not taking: Reported on 07/11/2017) 30 tablet 0 Not Taking  . folic acid (FOLVITE) 017 MCG tablet Take 400 mcg by mouth daily.   More than a month at Unknown time  . glucose blood (ACCU-CHEK SMARTVIEW) test strip Use as instructed to check blood sugars 100 each 12   . UNABLE TO FIND Take 1 tablet by mouth every 6 (six) hours as  needed (for pain). Med Name: bangol or pangol   Taking    I have reviewed patient's Past Medical Hx, Surgical Hx, Family Hx, Social Hx, medications and allergies.   Review of Systems: Negative except for what is mentioned in HPI.  Physical Exam   Blood pressure (!) 112/50, pulse (!) 101, temperature 97.9 F (36.6 C), temperature source Oral, resp. rate 20, height 5' 2.21" (1.58 m), weight 200 lb (90.7 kg), last menstrual period 03/11/2017, SpO2 100 %.  Constitutional: Well-developed, well-nourished female in no acute distress.  HENT: [place]/AT, normal oropharynx mucosa. MMM Eyes: normal conjunctivae, no scleral icterus Cardiovascular: normal rate, regular rhythm Respiratory: normal effort, lungs CTAB.  GI: Abd soft, non-tender, gravid appropriate for gestational age.   GU: Neg CVAT bilaterally SVE: closed/thick MSK: TTP on left lower back laterally Neurologic: Alert and oriented x 4. Psych: Normal mood and affect Skin: warm and dry   FHT:  Baseline 140 , moderate variability, accelerations present, no decelerations Toco: mild UI  MAU Course/MDM:   Nursing notes and VS reviewed. Patient seen and examined, as noted above.  UA wnl. Will send for culture given urinary frequency and hx of recurrent UTIs. Seems to have MSK pain. Flexeril and Tylenol given.   Pain improved with regimen above.   Assessment and Plan  Assessment: 1. Musculoskeletal back pain   2. Language barrier affecting health care   3. [redacted] weeks gestation of pregnancy   4. NST (non-stress test) reactive     Plan: --Discharge home in stable condition.  --Rx for flexeril --Instructed she may use Tylenol prn, hot packs.  Taim Wurm, Jenne Pane, MD 10/02/2017 8:09 PM

## 2017-10-02 NOTE — MAU Note (Signed)
Pt here with c/o back and abdominal pain. C/o urinary frequency as well. Denies bleeding or leaking. Reports good fetal movement.

## 2017-10-02 NOTE — Discharge Instructions (Signed)
Musculoskeletal Pain Musculoskeletal pain is muscle and bone aches and pains. This pain can occur in any part of the body. Follow these instructions at home:  Only take medicines for pain, discomfort, or fever as told by your health care provider.  You may continue all activities unless the activities cause more pain. When the pain lessens, slowly resume normal activities. Gradually increase the intensity and duration of the activities or exercise.  During periods of severe pain, bed rest may be helpful. Lie or sit in any position that is comfortable, but get out of bed and walk around at least every several hours.  If directed, put ice on the injured area. ? Put ice in a plastic bag. ? Place a towel between your skin and the bag. ? Leave the ice on for 20 minutes, 2-3 times a day. Contact a health care provider if:  Your pain is getting worse.  Your pain is not relieved with medicines.  You lose function in the area of the pain if the pain is in your arms, legs, or neck. This information is not intended to replace advice given to you by your health care provider. Make sure you discuss any questions you have with your health care provider. Document Released: 09/13/2005 Document Revised: 02/24/2016 Document Reviewed: 05/18/2013 Elsevier Interactive Patient Education  2017 [place] of Pregnancy The third trimester is from week 29 through week 42, months 7 through 9. This trimester is when your unborn baby (fetus) is growing very fast. At the end of the ninth month, the unborn baby is about 20 inches in length. It weighs about 6-10 pounds. Follow these instructions at home:  Avoid all smoking, herbs, and alcohol. Avoid drugs not approved by your doctor.  Do not use any tobacco products, including cigarettes, chewing tobacco, and electronic cigarettes. If you need help quitting, ask your doctor. You may get counseling or other support to help you quit.  Only take  medicine as told by your doctor. Some medicines are safe and some are not during pregnancy.  Exercise only as told by your doctor. Stop exercising if you start having cramps.  Eat regular, healthy meals.  Wear a good support bra if your breasts are tender.  Do not use hot tubs, steam rooms, or saunas.  Wear your seat belt when driving.  Avoid raw meat, uncooked cheese, and liter boxes and soil used by cats.  Take your prenatal vitamins.  Take 1500-2000 milligrams of calcium daily starting at the 20th week of pregnancy until you deliver your baby.  Try taking medicine that helps you poop (stool softener) as needed, and if your doctor approves. Eat more fiber by eating fresh fruit, vegetables, and whole grains. Drink enough fluids to keep your pee (urine) clear or pale yellow.  Take warm water baths (sitz baths) to soothe pain or discomfort caused by hemorrhoids. Use hemorrhoid cream if your doctor approves.  If you have puffy, bulging veins (varicose veins), wear support hose. Raise (elevate) your feet for 15 minutes, 3-4 times a day. Limit salt in your diet.  Avoid heavy lifting, wear low heels, and sit up straight.  Rest with your legs raised if you have leg cramps or low back pain.  Visit your dentist if you have not gone during your pregnancy. Use a soft toothbrush to brush your teeth. Be gentle when you floss.  You can have sex (intercourse) unless your doctor tells you not to.  Do not travel far  distances unless you must. Only do so with your doctor's approval.  Take prenatal classes.  Practice driving to the hospital.  Pack your hospital bag.  Prepare the baby's room.  Go to your doctor visits. Get help if:  You are not sure if you are in labor or if your water has broken.  You are dizzy.  You have mild cramps or pressure in your lower belly (abdominal).  You have a nagging pain in your belly area.  You continue to feel sick to your stomach (nauseous), throw  up (vomit), or have watery poop (diarrhea).  You have bad smelling fluid coming from your vagina.  You have pain with peeing (urination). Get help right away if:  You have a fever.  You are leaking fluid from your vagina.  You are spotting or bleeding from your vagina.  You have severe belly cramping or pain.  You lose or gain weight rapidly.  You have trouble catching your breath and have chest pain.  You notice sudden or extreme puffiness (swelling) of your face, hands, ankles, feet, or legs.  You have not felt the baby move in over an hour.  You have severe headaches that do not go away with medicine.  You have vision changes. This information is not intended to replace advice given to you by your health care provider. Make sure you discuss any questions you have with your health care provider. Document Released: 12/08/2009 Document Revised: 02/19/2016 Document Reviewed: 11/14/2012 Elsevier Interactive Patient Education  2017 [employer].

## 2017-10-03 LAB — GC/CHLAMYDIA PROBE AMP (~~LOC~~) NOT AT ARMC
Chlamydia: NEGATIVE
Neisseria Gonorrhea: NEGATIVE

## 2017-10-04 ENCOUNTER — Institutional Professional Consult (permissible substitution): Payer: Medicaid Other

## 2017-10-05 ENCOUNTER — Encounter: Payer: Medicaid Other | Attending: Family Medicine | Admitting: *Deleted

## 2017-10-05 DIAGNOSIS — Z713 Dietary counseling and surveillance: Secondary | ICD-10-CM | POA: Insufficient documentation

## 2017-10-05 DIAGNOSIS — O24419 Gestational diabetes mellitus in pregnancy, unspecified control: Secondary | ICD-10-CM | POA: Insufficient documentation

## 2017-10-05 DIAGNOSIS — R7309 Other abnormal glucose: Secondary | ICD-10-CM

## 2017-10-05 LAB — CULTURE, OB URINE

## 2017-10-11 NOTE — Progress Notes (Signed)
  Patient was seen on 10/05/2017 for Gestational Diabetes self-management Class. The following learning objectives were met by the patient :   States the definition of Gestational Diabetes  States why dietary management is important in controlling blood glucose  Describes the effects of carbohydrates on blood glucose levels  Demonstrates ability to create a balanced meal plan  Demonstrates carbohydrate counting   States when to check blood glucose levels  Demonstrates proper blood glucose monitoring techniques  States the effect of stress and exercise on blood glucose levels  States the importance of limiting caffeine and abstaining from alcohol and smoking  Plan:  Aim for 3 Carb Choices per meal (45 grams) +/- 1 either way  Aim for 1-2 Carbs per snack Begin reading food labels for Total Carbohydrate of foods Consider  increasing your activity level by walking or other activity daily as tolerated Begin checking BG before breakfast and 2 hours after first bite of breakfast, lunch and dinner as directed by MD  Bring Log Book to every medical appointment   Take medication if directed by MD  Patient already has a meter:  And is testing pre breakfast and 2 hours each meal as directed by MD   Patient instructed to monitor glucose levels: FBS: 60 - 95 mg/dl 2 hour: <120 mg/dl  Patient received the following handouts:  Nutrition Diabetes and Pregnancy  Carbohydrate Counting List  Patient will be seen for follow-up as needed.

## 2017-10-13 ENCOUNTER — Ambulatory Visit (INDEPENDENT_AMBULATORY_CARE_PROVIDER_SITE_OTHER): Payer: Medicaid Other | Admitting: Obstetrics & Gynecology

## 2017-10-13 VITALS — BP 108/65 | HR 100 | Wt 197.0 lb

## 2017-10-13 DIAGNOSIS — O09523 Supervision of elderly multigravida, third trimester: Secondary | ICD-10-CM

## 2017-10-13 DIAGNOSIS — O09529 Supervision of elderly multigravida, unspecified trimester: Secondary | ICD-10-CM

## 2017-10-13 DIAGNOSIS — N390 Urinary tract infection, site not specified: Secondary | ICD-10-CM

## 2017-10-13 DIAGNOSIS — Z3483 Encounter for supervision of other normal pregnancy, third trimester: Secondary | ICD-10-CM

## 2017-10-13 DIAGNOSIS — Z3480 Encounter for supervision of other normal pregnancy, unspecified trimester: Secondary | ICD-10-CM

## 2017-10-13 DIAGNOSIS — Z789 Other specified health status: Secondary | ICD-10-CM

## 2017-10-13 DIAGNOSIS — Z98891 History of uterine scar from previous surgery: Secondary | ICD-10-CM

## 2017-10-13 DIAGNOSIS — O24410 Gestational diabetes mellitus in pregnancy, diet controlled: Secondary | ICD-10-CM

## 2017-10-13 NOTE — Progress Notes (Signed)
   PRENATAL VISIT NOTE  Subjective:  Belinda Blake is a 37 y.o. G3P2002 at [redacted]w[redacted]d being seen today for ongoing prenatal care.  She is currently monitored for the following issues for this high-risk pregnancy and has Supervision of other normal pregnancy, antepartum; History of cesarean section; Frequent UTI; Language barrier affecting health care; AMA (advanced maternal age) multigravida 35+, unspecified trimester; and Gestational diabetes mellitus (GDM), antepartum on their problem list.  Patient reports no complaints.  Contractions: Not present. Vag. Bleeding: None.  Movement: Present. Denies leaking of fluid.   The following portions of the patient's history were reviewed and updated as appropriate: allergies, current medications, past family history, past medical history, past social history, past surgical history and problem list. Problem list updated.  Objective:   Vitals:   10/13/17 1024  BP: 108/65  Pulse: 100  Weight: 197 lb (89.4 kg)    Fetal Status: Fetal Heart Rate (bpm): 130   Movement: Present     General:  Alert, oriented and cooperative. Patient is in no acute distress.  Skin: Skin is warm and dry. No rash noted.   Cardiovascular: Normal heart rate noted  Respiratory: Normal respiratory effort, no problems with respiration noted  Abdomen: Soft, gravid, appropriate for gestational age.  Pain/Pressure: Present     Pelvic: Cervical exam deferred        Extremities: Normal range of motion.     Mental Status:  Normal mood and affect. Normal behavior. Normal judgment and thought content.   Assessment and Plan:  Pregnancy: G3P2002 at [redacted]w[redacted]d  1. Supervision of other normal pregnancy, antepartum  2. Language barrier affecting health care Pt understands [place] well. Has some challenges with saying words but, can spell them out.  3. History of cesarean section For repeat c-section with BTL at 39 weeks  4. Diet controlled gestational diabetes mellitus (GDM),  antepartum reviewd [facility] log. Only 4 results out of range.  Pt has lost ~4#s.  FH acceptable.       5. Frequent UTI  6. AMA (advanced maternal age) multigravida 37+, unspecified trimester  Preterm labor symptoms and general obstetric precautions including but not limited to vaginal bleeding, contractions, leaking of fluid and fetal movement were reviewed in detail with the patient. Please refer to After Visit Summary for other counseling recommendations.  2 weeks  Lavonia Drafts, MD

## 2017-10-22 ENCOUNTER — Encounter: Payer: Self-pay | Admitting: Obstetrics & Gynecology

## 2017-10-27 ENCOUNTER — Ambulatory Visit (INDEPENDENT_AMBULATORY_CARE_PROVIDER_SITE_OTHER): Payer: Medicaid Other | Admitting: Family Medicine

## 2017-10-27 VITALS — BP 98/67 | HR 107 | Wt 197.0 lb

## 2017-10-27 DIAGNOSIS — Z98891 History of uterine scar from previous surgery: Secondary | ICD-10-CM

## 2017-10-27 DIAGNOSIS — Z789 Other specified health status: Secondary | ICD-10-CM

## 2017-10-27 DIAGNOSIS — Z3480 Encounter for supervision of other normal pregnancy, unspecified trimester: Secondary | ICD-10-CM

## 2017-10-27 DIAGNOSIS — O09529 Supervision of elderly multigravida, unspecified trimester: Secondary | ICD-10-CM

## 2017-10-27 NOTE — Progress Notes (Signed)
   PRENATAL VISIT NOTE  Subjective:  Belinda Blake is a 37 y.o. G3P2002 at [redacted]w[redacted]d being seen today for ongoing prenatal care.  She is currently monitored for the following issues for this high-risk pregnancy and has Supervision of other normal pregnancy, antepartum; History of cesarean section; Frequent UTI; Language barrier affecting health care; AMA (advanced maternal age) multigravida 35+, unspecified trimester; and Gestational diabetes mellitus (GDM), antepartum on their problem list.  Patient reports no complaints.  Contractions: Not present. Vag. Bleeding: None.  Movement: Present. Denies leaking of fluid.   GDM: Patient diet controlled. Did not bring log, but reports CBGs as follows: Fasting: 70-80 2hr PP: mostly <100, but had 2 elevated over past couple of days.   The following portions of the patient's history were reviewed and updated as appropriate: allergies, current medications, past family history, past medical history, past social history, past surgical history and problem list. Problem list updated.  Objective:   Vitals:   10/27/17 0950  BP: 98/67  Pulse: (!) 107  Weight: 197 lb (89.4 kg)    Fetal Status: Fetal Heart Rate (bpm): 140   Movement: Present     General:  Alert, oriented and cooperative. Patient is in no acute distress.  Skin: Skin is warm and dry. No rash noted.   Cardiovascular: Normal heart rate noted  Respiratory: Normal respiratory effort, no problems with respiration noted  Abdomen: Soft, gravid, appropriate for gestational age.  Pain/Pressure: Absent     Pelvic: Cervical exam deferred        Extremities: Normal range of motion.     Mental Status:  Normal mood and affect. Normal behavior. Normal judgment and thought content.   Assessment and Plan:  Pregnancy: G3P2002 at [redacted]w[redacted]d  1. Supervision of other normal pregnancy, antepartum FHT and FH normal  2. Language barrier affecting health care Interpreter declined. Understands english well.  3.  History of cesarean section 2 prior cesarean sections. Contemplating a TOLAC  4. AMA (advanced maternal age) multigravida 66+, unspecified trimester NIPS low risk. No additional testing.   Preterm labor symptoms and general obstetric precautions including but not limited to vaginal bleeding, contractions, leaking of fluid and fetal movement were reviewed in detail with the patient. Please refer to After Visit Summary for other counseling recommendations.  No Follow-up on file.   Truett Mainland, DO

## 2017-11-01 ENCOUNTER — Ambulatory Visit: Payer: Medicaid Other

## 2017-11-10 ENCOUNTER — Ambulatory Visit (INDEPENDENT_AMBULATORY_CARE_PROVIDER_SITE_OTHER): Payer: Medicaid Other | Admitting: Family Medicine

## 2017-11-10 ENCOUNTER — Other Ambulatory Visit: Payer: Self-pay | Admitting: Obstetrics & Gynecology

## 2017-11-10 ENCOUNTER — Encounter (HOSPITAL_COMMUNITY): Payer: Self-pay

## 2017-11-10 ENCOUNTER — Encounter: Payer: Self-pay | Admitting: Family Medicine

## 2017-11-10 VITALS — BP 90/55 | HR 100 | Wt 198.0 lb

## 2017-11-10 DIAGNOSIS — O24410 Gestational diabetes mellitus in pregnancy, diet controlled: Secondary | ICD-10-CM

## 2017-11-10 DIAGNOSIS — Z3483 Encounter for supervision of other normal pregnancy, third trimester: Secondary | ICD-10-CM

## 2017-11-10 DIAGNOSIS — Z3480 Encounter for supervision of other normal pregnancy, unspecified trimester: Secondary | ICD-10-CM

## 2017-11-10 DIAGNOSIS — Z98891 History of uterine scar from previous surgery: Secondary | ICD-10-CM

## 2017-11-10 DIAGNOSIS — O09523 Supervision of elderly multigravida, third trimester: Secondary | ICD-10-CM

## 2017-11-10 DIAGNOSIS — O09529 Supervision of elderly multigravida, unspecified trimester: Secondary | ICD-10-CM

## 2017-11-10 DIAGNOSIS — Z789 Other specified health status: Secondary | ICD-10-CM

## 2017-11-10 NOTE — Progress Notes (Signed)
Subjective:  Belinda Blake is a 37 y.o. G3P2002 at [redacted]w[redacted]d being seen today for ongoing prenatal care.  She is currently monitored for the following issues for this high-risk pregnancy and has Supervision of other normal pregnancy, antepartum; History of cesarean section; Frequent UTI; Language barrier affecting health care; AMA (advanced maternal age) multigravida 35+, unspecified trimester; and Gestational diabetes mellitus (GDM), antepartum on their problem list.  GDM: Patient diet controlled.  Reports occasional hypoglycemic symptoms. Fasting: controlled 2hr PP: 5 elevated CBGs: 120-130, had a spoonful of honey yesterday and had CBG of 150  Patient reports no complaints.  Contractions: Not present. Vag. Bleeding: None.  Movement: Present. Denies leaking of fluid.   The following portions of the patient's history were reviewed and updated as appropriate: allergies, current medications, past family history, past medical history, past social history, past surgical history and problem list. Problem list updated.  Objective:   Vitals:   11/10/17 1023  BP: (!) 90/55  Pulse: 100  Weight: 198 lb (89.8 kg)    Fetal Status: Fetal Heart Rate (bpm): 160   Movement: Present     General:  Alert, oriented and cooperative. Patient is in no acute distress.  Skin: Skin is warm and dry. No rash noted.   Cardiovascular: Normal heart rate noted  Respiratory: Normal respiratory effort, no problems with respiration noted  Abdomen: Soft, gravid, appropriate for gestational age. Pain/Pressure: Absent     Pelvic: Vag. Bleeding: None     Cervical exam deferred        Extremities: Normal range of motion.     Mental Status: Normal mood and affect. Normal behavior. Normal judgment and thought content.   Urinalysis:      Assessment and Plan:  Pregnancy: G3P2002 at [redacted]w[redacted]d  1. Supervision of other normal pregnancy, antepartum FHT and FH normal  2. Language barrier affecting health care Understands english  well   3. History of cesarean section Pt desires RLTCS now. Will schedule  4. AMA (advanced maternal age) multigravida 35+, unspecified trimester - [place] [facility] OB FOLLOW UP; Future  5. Diet controlled gestational diabetes mellitus (GDM), antepartum Will continue to watch CBGs closely. May need to start metformin - [place] [facility] OB FOLLOW UP; Future  Preterm labor symptoms and general obstetric precautions including but not limited to vaginal bleeding, contractions, leaking of fluid and fetal movement were reviewed in detail with the patient. Please refer to After Visit Summary for other counseling recommendations.  No Follow-up on file.   Truett Mainland, DO

## 2017-11-18 ENCOUNTER — Encounter: Payer: Self-pay | Admitting: Family Medicine

## 2017-11-18 ENCOUNTER — Other Ambulatory Visit (HOSPITAL_COMMUNITY)
Admission: RE | Admit: 2017-11-18 | Discharge: 2017-11-18 | Disposition: A | Discharge status: Home / Self Care | Payer: Medicaid Other | Source: Ambulatory Visit | Attending: Family Medicine | Admitting: Family Medicine

## 2017-11-18 ENCOUNTER — Ambulatory Visit (INDEPENDENT_AMBULATORY_CARE_PROVIDER_SITE_OTHER): Payer: Medicaid Other | Admitting: Family Medicine

## 2017-11-18 VITALS — BP 97/50 | HR 96 | Wt 196.0 lb

## 2017-11-18 DIAGNOSIS — O09529 Supervision of elderly multigravida, unspecified trimester: Secondary | ICD-10-CM

## 2017-11-18 DIAGNOSIS — Z98891 History of uterine scar from previous surgery: Secondary | ICD-10-CM

## 2017-11-18 DIAGNOSIS — O24410 Gestational diabetes mellitus in pregnancy, diet controlled: Secondary | ICD-10-CM

## 2017-11-18 DIAGNOSIS — Z3480 Encounter for supervision of other normal pregnancy, unspecified trimester: Secondary | ICD-10-CM

## 2017-11-18 DIAGNOSIS — Z789 Other specified health status: Secondary | ICD-10-CM

## 2017-11-18 DIAGNOSIS — R102 Pelvic and perineal pain: Secondary | ICD-10-CM

## 2017-11-18 DIAGNOSIS — O09523 Supervision of elderly multigravida, third trimester: Secondary | ICD-10-CM | POA: Insufficient documentation

## 2017-11-18 DIAGNOSIS — O26899 Other specified pregnancy related conditions, unspecified trimester: Secondary | ICD-10-CM

## 2017-11-18 DIAGNOSIS — Z3490 Encounter for supervision of normal pregnancy, unspecified, unspecified trimester: Secondary | ICD-10-CM

## 2017-11-18 DIAGNOSIS — Z3A36 36 weeks gestation of pregnancy: Secondary | ICD-10-CM | POA: Diagnosis not present

## 2017-11-18 DIAGNOSIS — Z3483 Encounter for supervision of other normal pregnancy, third trimester: Secondary | ICD-10-CM | POA: Diagnosis present

## 2017-11-18 NOTE — Progress Notes (Signed)
   PRENATAL VISIT NOTE  Subjective:  Belinda Blake is a 37 y.o. G3P2002 at [redacted]w[redacted]d being seen today for ongoing prenatal care.  She is currently monitored for the following issues for this high-risk pregnancy and has Supervision of other normal pregnancy, antepartum; History of cesarean section; Frequent UTI; Language barrier affecting health care; AMA (advanced maternal age) multigravida 35+, unspecified trimester; and Gestational diabetes mellitus (GDM), antepartum on their problem list.  Patient reports pelvic pain - difficulty moving around or rolling over in bed. worse with walking..  Contractions: Not present. Vag. Bleeding: None.  Movement: Present. Denies leaking of fluid.   GDM: Patient diet controlled.   Fasting: controlled 2hr PP: 3 elevated.   The following portions of the patient's history were reviewed and updated as appropriate: allergies, current medications, past family history, past medical history, past social history, past surgical history and problem list. Problem list updated.  Objective:   Vitals:   11/18/17 0908  BP: (!) 97/50  Pulse: 96  Weight: 196 lb (88.9 kg)    Fetal Status: Fetal Heart Rate (bpm): 140 Fundal Height: 36 cm Movement: Present     General:  Alert, oriented and cooperative. Patient is in no acute distress.  Skin: Skin is warm and dry. No rash noted.   Cardiovascular: Normal heart rate noted  Respiratory: Normal respiratory effort, no problems with respiration noted  Abdomen: Soft, gravid, appropriate for gestational age.  Pain/Pressure: Present     Pelvic: Cervical exam deferred        Extremities: Normal range of motion.  Edema: None  Mental Status:  Normal mood and affect. Normal behavior. Normal judgment and thought content.   Assessment and Plan:  Pregnancy: G3P2002 at [redacted]w[redacted]d  1. Prenatal care, antepartum FHT and FH normal - GC/Chlamydia probe amp ([place])not at [facility] - Culture, beta strep (group b only)  2. Supervision of  other normal pregnancy, antepartum  3. AMA (advanced maternal age) multigravida 31+, unspecified trimester No additional testing needed  4. Diet controlled gestational diabetes mellitus (GDM), antepartum Controlled  5. Language barrier affecting health care  6. History of cesarean section Repeat c/s scheduled  7. Pelvic pain in pregnancy Exercises given  Preterm labor symptoms and general obstetric precautions including but not limited to vaginal bleeding, contractions, leaking of fluid and fetal movement were reviewed in detail with the patient. Please refer to After Visit Summary for other counseling recommendations.  Return for OB f/u.   Truett Mainland, DO

## 2017-11-21 LAB — GC/CHLAMYDIA PROBE AMP (~~LOC~~) NOT AT ARMC
Chlamydia: NEGATIVE
NEISSERIA GONORRHEA: NEGATIVE

## 2017-11-22 LAB — CULTURE, BETA STREP (GROUP B ONLY): STREP GP B CULTURE: NEGATIVE

## 2017-11-24 ENCOUNTER — Encounter: Payer: Medicaid Other | Admitting: Family Medicine

## 2017-11-25 ENCOUNTER — Ambulatory Visit (INDEPENDENT_AMBULATORY_CARE_PROVIDER_SITE_OTHER): Payer: Medicaid Other | Admitting: Family Medicine

## 2017-11-25 VITALS — BP 94/54 | HR 94 | Wt 197.0 lb

## 2017-11-25 DIAGNOSIS — Z3480 Encounter for supervision of other normal pregnancy, unspecified trimester: Secondary | ICD-10-CM

## 2017-11-25 DIAGNOSIS — Z98891 History of uterine scar from previous surgery: Secondary | ICD-10-CM

## 2017-11-25 DIAGNOSIS — O24410 Gestational diabetes mellitus in pregnancy, diet controlled: Secondary | ICD-10-CM

## 2017-11-25 DIAGNOSIS — Z3483 Encounter for supervision of other normal pregnancy, third trimester: Secondary | ICD-10-CM

## 2017-11-25 DIAGNOSIS — O09523 Supervision of elderly multigravida, third trimester: Secondary | ICD-10-CM

## 2017-11-25 DIAGNOSIS — O09529 Supervision of elderly multigravida, unspecified trimester: Secondary | ICD-10-CM

## 2017-11-25 DIAGNOSIS — Z789 Other specified health status: Secondary | ICD-10-CM

## 2017-11-25 NOTE — Progress Notes (Signed)
Subjective:  Tiffane Sheldon is a 37 y.o. G3P2002 at [redacted]w[redacted]d being seen today for ongoing prenatal care.  She is currently monitored for the following issues for this high-risk pregnancy and has Supervision of other normal pregnancy, antepartum; History of cesarean section; Frequent UTI; Language barrier affecting health care; AMA (advanced maternal age) multigravida 35+, unspecified trimester; and Gestational diabetes mellitus (GDM), antepartum on their problem list.  GDM: Patient diet controlled.  Reports no hypoglycemic episodes. Did not bring log book - reports CBGs as: Fasting: controlled 2hr PP: controlled  Patient reports backache.  Contractions: Irregular. Vag. Bleeding: None.  Movement: Present. Denies leaking of fluid.   The following portions of the patient's history were reviewed and updated as appropriate: allergies, current medications, past family history, past medical history, past social history, past surgical history and problem list. Problem list updated.  Objective:   Vitals:   11/25/17 1014  BP: (!) 94/54  Pulse: 94  Weight: 197 lb (89.4 kg)    Fetal Status: Fetal Heart Rate (bpm): 140   Movement: Present     General:  Alert, oriented and cooperative. Patient is in no acute distress.  Skin: Skin is warm and dry. No rash noted.   Cardiovascular: Normal heart rate noted  Respiratory: Normal respiratory effort, no problems with respiration noted  Abdomen: Soft, gravid, appropriate for gestational age. Pain/Pressure: Present     Pelvic: Vag. Bleeding: None     Cervical exam deferred        Extremities: Normal range of motion.     Mental Status: Normal mood and affect. Normal behavior. Normal judgment and thought content.   Urinalysis:      Assessment and Plan:  Pregnancy: G3P2002 at [redacted]w[redacted]d  1. Supervision of other normal pregnancy, antepartum FHT and FH normal  2. AMA (advanced maternal age) multigravida 40+, unspecified trimester  3. Diet controlled gestational  diabetes mellitus (GDM), antepartum Continue diet controll  4. Language barrier affecting health care Declined interpreter - speaks english fairly well  5. History of cesarean section Rpt c/s 3/16   Term labor symptoms and general obstetric precautions including but not limited to vaginal bleeding, contractions, leaking of fluid and fetal movement were reviewed in detail with the patient. Please refer to After Visit Summary for other counseling recommendations.  No Follow-up on file.   Truett Mainland, DO

## 2017-11-28 ENCOUNTER — Encounter (HOSPITAL_COMMUNITY): Payer: Self-pay

## 2017-11-28 NOTE — Pre-Procedure Instructions (Signed)
Interpreter number [phone]

## 2017-12-01 ENCOUNTER — Encounter (HOSPITAL_COMMUNITY): Payer: Self-pay

## 2017-12-01 ENCOUNTER — Other Ambulatory Visit: Payer: Self-pay | Admitting: Family Medicine

## 2017-12-01 ENCOUNTER — Ambulatory Visit (HOSPITAL_COMMUNITY)
Admission: RE | Admit: 2017-12-01 | Discharge: 2017-12-01 | Disposition: A | Discharge status: Home / Self Care | Payer: Medicaid Other | Source: Ambulatory Visit | Attending: Family Medicine | Admitting: Family Medicine

## 2017-12-01 DIAGNOSIS — Z789 Other specified health status: Secondary | ICD-10-CM

## 2017-12-01 DIAGNOSIS — O34219 Maternal care for unspecified type scar from previous cesarean delivery: Secondary | ICD-10-CM | POA: Insufficient documentation

## 2017-12-01 DIAGNOSIS — Z3A37 37 weeks gestation of pregnancy: Secondary | ICD-10-CM

## 2017-12-01 DIAGNOSIS — O09523 Supervision of elderly multigravida, third trimester: Secondary | ICD-10-CM | POA: Insufficient documentation

## 2017-12-01 DIAGNOSIS — O24410 Gestational diabetes mellitus in pregnancy, diet controlled: Secondary | ICD-10-CM | POA: Insufficient documentation

## 2017-12-01 DIAGNOSIS — O09529 Supervision of elderly multigravida, unspecified trimester: Secondary | ICD-10-CM

## 2017-12-01 HISTORY — DX: Gestational diabetes mellitus in pregnancy, unspecified control: O24.419

## 2017-12-02 ENCOUNTER — Ambulatory Visit (INDEPENDENT_AMBULATORY_CARE_PROVIDER_SITE_OTHER): Payer: Medicaid Other | Admitting: Family Medicine

## 2017-12-02 VITALS — BP 110/61 | HR 92 | Wt 197.0 lb

## 2017-12-02 DIAGNOSIS — Z3480 Encounter for supervision of other normal pregnancy, unspecified trimester: Secondary | ICD-10-CM

## 2017-12-02 DIAGNOSIS — Z98891 History of uterine scar from previous surgery: Secondary | ICD-10-CM

## 2017-12-02 DIAGNOSIS — O24410 Gestational diabetes mellitus in pregnancy, diet controlled: Secondary | ICD-10-CM

## 2017-12-02 DIAGNOSIS — Z3483 Encounter for supervision of other normal pregnancy, third trimester: Secondary | ICD-10-CM

## 2017-12-02 DIAGNOSIS — Z789 Other specified health status: Secondary | ICD-10-CM

## 2017-12-02 DIAGNOSIS — O09529 Supervision of elderly multigravida, unspecified trimester: Secondary | ICD-10-CM

## 2017-12-02 DIAGNOSIS — O09523 Supervision of elderly multigravida, third trimester: Secondary | ICD-10-CM

## 2017-12-02 NOTE — Progress Notes (Signed)
Subjective:  Belinda Blake is a 37 y.o. G3P2002 at [redacted]w[redacted]d being seen today for ongoing prenatal care.  She is currently monitored for the following issues for this high-risk pregnancy and has Supervision of other normal pregnancy, antepartum; History of cesarean section; Frequent UTI; Language barrier affecting health care; AMA (advanced maternal age) multigravida 35+, unspecified trimester; and Gestational diabetes mellitus (GDM), antepartum on their problem list.  GDM: Patient diet controlled.  Reports no hypoglycemic episodes.  Tolerating medication well Fasting: controlled 2hr PP: mostly controlled. A few after dinner elevated  Patient reports no complaints.  Contractions: Irritability. Vag. Bleeding: Scant.  Movement: Present. Denies leaking of fluid.   The following portions of the patient's history were reviewed and updated as appropriate: allergies, current medications, past family history, past medical history, past social history, past surgical history and problem list. Problem list updated.  Objective:   Vitals:   12/02/17 1048  BP: 110/61  Pulse: 92  Weight: 197 lb (89.4 kg)    Fetal Status: Fetal Heart Rate (bpm): 140   Movement: Present     General:  Alert, oriented and cooperative. Patient is in no acute distress.  Skin: Skin is warm and dry. No rash noted.   Cardiovascular: Normal heart rate noted  Respiratory: Normal respiratory effort, no problems with respiration noted  Abdomen: Soft, gravid, appropriate for gestational age. Pain/Pressure: Present     Pelvic: Vag. Bleeding: Scant     Cervical exam deferred        Extremities: Normal range of motion.  Edema: None  Mental Status: Normal mood and affect. Normal behavior. Normal judgment and thought content.   Urinalysis:      Assessment and Plan:  Pregnancy: G3P2002 at [redacted]w[redacted]d  1. Supervision of other normal pregnancy, antepartum FHT and FH normal  2. AMA (advanced maternal age) multigravida 58+, unspecified  trimester  3. Diet controlled gestational diabetes mellitus (GDM), antepartum Continue CBGs.   4. Language barrier affecting health care Interpreter declined  5. History of cesarean section Repeat next week  Term labor symptoms and general obstetric precautions including but not limited to vaginal bleeding, contractions, leaking of fluid and fetal movement were reviewed in detail with the patient. Please refer to After Visit Summary for other counseling recommendations.  Return in about 3 weeks (around 12/23/2017) for postpartum incision check.   Truett Mainland, DO

## 2017-12-08 NOTE — Patient Instructions (Signed)
Belinda Blake  12/08/2017   Your procedure is scheduled on:  12/10/2017  Enter through the Main Entrance of [facility] [place] up the phone at the desk and dial [phone]  Call this number if you have problems the morning of surgery:[phone]  Remember:   Do not eat food:(After Midnight) Desps de medianoche.  Do not drink clear liquids: (After Midnight) Desps de medianoche.  Take these medicines the morning of surgery with A SIP OF WATER: none   Do not wear jewelry, make-up or nail polish.  Do not wear lotions, powders, or perfumes. Do not wear deodorant.  Do not shave 48 hours prior to surgery.  Do not bring valuables to the hospital.  [facility] is not   responsible for any belongings or valuables brought to the hospital.  Contacts, dentures or bridgework may not be worn into surgery.  Leave suitcase in the car. After surgery it may be brought to your room.  For patients admitted to the hospital, checkout time is 11:00 AM the day of              discharge.    N/A   Please read over the following fact sheets that you were given:   Surgical Site Infection Prevention

## 2017-12-09 ENCOUNTER — Encounter (HOSPITAL_COMMUNITY)
Admission: RE | Admit: 2017-12-09 | Discharge: 2017-12-09 | Disposition: A | Discharge status: Home / Self Care | Payer: Medicaid Other | Source: Ambulatory Visit | Attending: Obstetrics & Gynecology | Admitting: Obstetrics & Gynecology

## 2017-12-09 LAB — TYPE AND SCREEN
ABO/RH(D): O POS
Antibody Screen: NEGATIVE

## 2017-12-09 LAB — CBC
HCT: 32.7 % — ABNORMAL LOW (ref 36.0–46.0)
Hemoglobin: 10.7 g/dL — ABNORMAL LOW (ref 12.0–15.0)
MCH: 28.8 pg (ref 26.0–34.0)
MCHC: 32.7 g/dL (ref 30.0–36.0)
MCV: 88.1 fL (ref 78.0–100.0)
PLATELETS: 225 10*3/uL (ref 150–400)
RBC: 3.71 MIL/uL — ABNORMAL LOW (ref 3.87–5.11)
RDW: 14.2 % (ref 11.5–15.5)
WBC: 5.6 10*3/uL (ref 4.0–10.5)

## 2017-12-09 LAB — ABO/RH: ABO/RH(D): O POS

## 2017-12-10 ENCOUNTER — Inpatient Hospital Stay (HOSPITAL_COMMUNITY): Payer: Medicaid Other | Admitting: Certified Registered Nurse Anesthetist

## 2017-12-10 ENCOUNTER — Inpatient Hospital Stay (HOSPITAL_COMMUNITY)
Admission: AD | Admit: 2017-12-10 | Discharge: 2017-12-12 | DRG: 788 | Disposition: A | Discharge status: Home / Self Care | Payer: Medicaid Other | Source: Ambulatory Visit | Attending: Obstetrics & Gynecology | Admitting: Obstetrics & Gynecology

## 2017-12-10 ENCOUNTER — Encounter (HOSPITAL_COMMUNITY)
Admission: AD | Disposition: A | Discharge status: Home / Self Care | Payer: Self-pay | Source: Ambulatory Visit | Attending: Obstetrics & Gynecology

## 2017-12-10 ENCOUNTER — Encounter (HOSPITAL_COMMUNITY): Payer: Self-pay | Admitting: Neonatal-Perinatal Medicine

## 2017-12-10 DIAGNOSIS — Z789 Other specified health status: Secondary | ICD-10-CM

## 2017-12-10 DIAGNOSIS — O24420 Gestational diabetes mellitus in childbirth, diet controlled: Secondary | ICD-10-CM | POA: Diagnosis present

## 2017-12-10 DIAGNOSIS — O34211 Maternal care for low transverse scar from previous cesarean delivery: Principal | ICD-10-CM | POA: Diagnosis present

## 2017-12-10 DIAGNOSIS — Z87891 Personal history of nicotine dependence: Secondary | ICD-10-CM

## 2017-12-10 DIAGNOSIS — O24419 Gestational diabetes mellitus in pregnancy, unspecified control: Secondary | ICD-10-CM | POA: Diagnosis present

## 2017-12-10 DIAGNOSIS — Z98891 History of uterine scar from previous surgery: Secondary | ICD-10-CM

## 2017-12-10 DIAGNOSIS — Z3A39 39 weeks gestation of pregnancy: Secondary | ICD-10-CM

## 2017-12-10 LAB — GLUCOSE, CAPILLARY: GLUCOSE-CAPILLARY: 79 mg/dL (ref 65–99)

## 2017-12-10 LAB — RPR: RPR: NONREACTIVE

## 2017-12-10 SURGERY — Surgical Case
Anesthesia: Spinal

## 2017-12-10 MED ORDER — NALBUPHINE HCL 10 MG/ML IJ SOLN: 5.0000 mg | INTRAMUSCULAR | Status: DC | PRN

## 2017-12-10 MED ORDER — PRENATAL MULTIVITAMIN CH
1.0000 | ORAL_TABLET | Freq: Every day | ORAL | Status: DC
  Administered 2017-12-12: 1 via ORAL
  Filled 2017-12-10: qty 1

## 2017-12-10 MED ORDER — IBUPROFEN 600 MG PO TABS
600.0000 mg | ORAL_TABLET | Freq: Four times a day (QID) | ORAL | Status: DC
  Administered 2017-12-10 – 2017-12-12 (×7): 600 mg via ORAL
  Filled 2017-12-10 (×8): qty 1

## 2017-12-10 MED ORDER — OXYCODONE-ACETAMINOPHEN 5-325 MG PO TABS: 2.0000 | ORAL_TABLET | ORAL | Status: DC | PRN

## 2017-12-10 MED ORDER — ONDANSETRON HCL 4 MG/2ML IJ SOLN
4.0000 mg | Freq: Three times a day (TID) | INTRAMUSCULAR | Status: DC | PRN
  Administered 2017-12-10: 4 mg via INTRAVENOUS
  Filled 2017-12-10: qty 2

## 2017-12-10 MED ORDER — KETOROLAC TROMETHAMINE 30 MG/ML IJ SOLN: 30.0000 mg | Freq: Four times a day (QID) | INTRAMUSCULAR | Status: AC | PRN

## 2017-12-10 MED ORDER — WITCH HAZEL-GLYCERIN EX PADS: 1.0000 "application " | MEDICATED_PAD | CUTANEOUS | Status: DC | PRN

## 2017-12-10 MED ORDER — COCONUT OIL OIL: 1.0000 "application " | TOPICAL_OIL | Status: DC | PRN

## 2017-12-10 MED ORDER — SCOPOLAMINE 1 MG/3DAYS TD PT72: 1.0000 | MEDICATED_PATCH | Freq: Once | TRANSDERMAL | Status: DC

## 2017-12-10 MED ORDER — ZOLPIDEM TARTRATE 5 MG PO TABS: 5.0000 mg | ORAL_TABLET | Freq: Every evening | ORAL | Status: DC | PRN

## 2017-12-10 MED ORDER — ONDANSETRON HCL 4 MG/2ML IJ SOLN
INTRAMUSCULAR | Status: AC
  Filled 2017-12-10: qty 2

## 2017-12-10 MED ORDER — BUPIVACAINE HCL (PF) 0.5 % IJ SOLN
INTRAMUSCULAR | Status: DC | PRN
  Administered 2017-12-10: 30 mL

## 2017-12-10 MED ORDER — FENTANYL CITRATE (PF) 100 MCG/2ML IJ SOLN: 25.0000 ug | INTRAMUSCULAR | Status: DC | PRN

## 2017-12-10 MED ORDER — DEXAMETHASONE SODIUM PHOSPHATE 10 MG/ML IJ SOLN
INTRAMUSCULAR | Status: AC
  Filled 2017-12-10: qty 1

## 2017-12-10 MED ORDER — DIPHENHYDRAMINE HCL 25 MG PO CAPS
25.0000 mg | ORAL_CAPSULE | ORAL | Status: DC | PRN
Start: 2017-12-10 — End: 2017-12-12

## 2017-12-10 MED ORDER — NALOXONE HCL 0.4 MG/ML IJ SOLN
0.4000 mg | INTRAMUSCULAR | Status: DC | PRN
Start: 2017-12-10 — End: 2017-12-12

## 2017-12-10 MED ORDER — OXYTOCIN 10 UNIT/ML IJ SOLN
INTRAMUSCULAR | Status: AC
  Filled 2017-12-10: qty 4

## 2017-12-10 MED ORDER — SODIUM CHLORIDE 0.9% FLUSH: 3.0000 mL | INTRAVENOUS | Status: DC | PRN

## 2017-12-10 MED ORDER — OXYCODONE HCL 5 MG PO TABS: 5.0000 mg | ORAL_TABLET | Freq: Once | ORAL | Status: DC | PRN

## 2017-12-10 MED ORDER — MORPHINE SULFATE (PF) 0.5 MG/ML IJ SOLN
INTRAMUSCULAR | Status: DC | PRN
  Administered 2017-12-10: .2 mg via INTRATHECAL

## 2017-12-10 MED ORDER — OXYCODONE-ACETAMINOPHEN 5-325 MG PO TABS
1.0000 | ORAL_TABLET | ORAL | Status: DC | PRN
  Administered 2017-12-11 (×2): 1 via ORAL
  Filled 2017-12-10 (×2): qty 1

## 2017-12-10 MED ORDER — ONDANSETRON HCL 4 MG/2ML IJ SOLN: 4.0000 mg | Freq: Four times a day (QID) | INTRAMUSCULAR | Status: DC | PRN

## 2017-12-10 MED ORDER — DIPHENHYDRAMINE HCL 50 MG/ML IJ SOLN: 12.5000 mg | INTRAMUSCULAR | Status: DC | PRN

## 2017-12-10 MED ORDER — SIMETHICONE 80 MG PO CHEW
80.0000 mg | CHEWABLE_TABLET | Freq: Three times a day (TID) | ORAL | Status: DC
  Administered 2017-12-10 – 2017-12-12 (×4): 80 mg via ORAL
  Filled 2017-12-10 (×4): qty 1

## 2017-12-10 MED ORDER — DIPHENHYDRAMINE HCL 25 MG PO CAPS: 25.0000 mg | ORAL_CAPSULE | Freq: Four times a day (QID) | ORAL | Status: DC | PRN

## 2017-12-10 MED ORDER — PHENYLEPHRINE 8 MG IN D5W 100 ML (0.08MG/ML) PREMIX OPTIME
INJECTION | INTRAVENOUS | Status: DC | PRN
  Administered 2017-12-10: 60 ug/min via INTRAVENOUS

## 2017-12-10 MED ORDER — DEXAMETHASONE SODIUM PHOSPHATE 10 MG/ML IJ SOLN
INTRAMUSCULAR | Status: DC | PRN
  Administered 2017-12-10: 4 mg via INTRAVENOUS

## 2017-12-10 MED ORDER — FENTANYL CITRATE (PF) 100 MCG/2ML IJ SOLN
INTRAMUSCULAR | Status: AC
  Filled 2017-12-10: qty 2

## 2017-12-10 MED ORDER — NALOXONE HCL 4 MG/10ML IJ SOLN: 1.0000 ug/kg/h | INTRAVENOUS | Status: DC | PRN

## 2017-12-10 MED ORDER — MENTHOL 3 MG MT LOZG: 1.0000 | LOZENGE | OROMUCOSAL | Status: DC | PRN

## 2017-12-10 MED ORDER — ONDANSETRON HCL 4 MG/2ML IJ SOLN
INTRAMUSCULAR | Status: DC | PRN
  Administered 2017-12-10: 4 mg via INTRAVENOUS

## 2017-12-10 MED ORDER — OXYCODONE HCL 5 MG/5ML PO SOLN: 5.0000 mg | Freq: Once | ORAL | Status: DC | PRN

## 2017-12-10 MED ORDER — SODIUM CHLORIDE 0.9 % IR SOLN
Status: DC | PRN
  Administered 2017-12-10: 1

## 2017-12-10 MED ORDER — NALBUPHINE HCL 10 MG/ML IJ SOLN: 5.0000 mg | Freq: Once | INTRAMUSCULAR | Status: DC | PRN

## 2017-12-10 MED ORDER — LACTATED RINGERS IV SOLN
INTRAVENOUS | Status: DC
  Administered 2017-12-10 (×2): via INTRAVENOUS

## 2017-12-10 MED ORDER — ACETAMINOPHEN 325 MG PO TABS: 650.0000 mg | ORAL_TABLET | ORAL | Status: DC | PRN

## 2017-12-10 MED ORDER — CEFAZOLIN SODIUM-DEXTROSE 2-4 GM/100ML-% IV SOLN
2.0000 g | INTRAVENOUS | Status: AC
  Administered 2017-12-10: 2 g via INTRAVENOUS
  Filled 2017-12-10: qty 100

## 2017-12-10 MED ORDER — BUPIVACAINE HCL (PF) 0.5 % IJ SOLN
INTRAMUSCULAR | Status: AC
  Filled 2017-12-10: qty 30

## 2017-12-10 MED ORDER — SIMETHICONE 80 MG PO CHEW: 80.0000 mg | CHEWABLE_TABLET | ORAL | Status: DC | PRN

## 2017-12-10 MED ORDER — DIBUCAINE 1 % RE OINT: 1.0000 "application " | TOPICAL_OINTMENT | RECTAL | Status: DC | PRN

## 2017-12-10 MED ORDER — LACTATED RINGERS IV SOLN
INTRAVENOUS | Status: DC
  Administered 2017-12-10: 125 mL/h via INTRAVENOUS
  Administered 2017-12-11: 03:00:00 via INTRAVENOUS

## 2017-12-10 MED ORDER — SIMETHICONE 80 MG PO CHEW
80.0000 mg | CHEWABLE_TABLET | ORAL | Status: DC
  Administered 2017-12-11 – 2017-12-12 (×2): 80 mg via ORAL
  Filled 2017-12-10 (×2): qty 1

## 2017-12-10 MED ORDER — BUPIVACAINE IN DEXTROSE 0.75-8.25 % IT SOLN
INTRATHECAL | Status: DC | PRN
  Administered 2017-12-10: 1.6 mL via INTRATHECAL

## 2017-12-10 MED ORDER — SENNOSIDES-DOCUSATE SODIUM 8.6-50 MG PO TABS
2.0000 | ORAL_TABLET | ORAL | Status: DC
  Administered 2017-12-11: 2 via ORAL
  Filled 2017-12-10 (×2): qty 2

## 2017-12-10 MED ORDER — LACTATED RINGERS IV SOLN
INTRAVENOUS | Status: DC | PRN
Start: 2017-12-10 — End: 2017-12-10
  Administered 2017-12-10: 12:00:00 via INTRAVENOUS

## 2017-12-10 MED ORDER — MORPHINE SULFATE (PF) 0.5 MG/ML IJ SOLN
INTRAMUSCULAR | Status: AC
  Filled 2017-12-10: qty 10

## 2017-12-10 MED ORDER — OXYTOCIN 40 UNITS IN LACTATED RINGERS INFUSION - SIMPLE MED: 2.5000 [IU]/h | INTRAVENOUS | Status: AC

## 2017-12-10 MED ORDER — MEPERIDINE HCL 25 MG/ML IJ SOLN: 6.2500 mg | INTRAMUSCULAR | Status: DC | PRN

## 2017-12-10 MED ORDER — OXYTOCIN 10 UNIT/ML IJ SOLN
INTRAMUSCULAR | Status: DC | PRN
  Administered 2017-12-10: 40 [IU] via INTRAVENOUS

## 2017-12-10 MED ORDER — FENTANYL CITRATE (PF) 100 MCG/2ML IJ SOLN
INTRAMUSCULAR | Status: DC | PRN
  Administered 2017-12-10: 10 ug via INTRATHECAL
  Administered 2017-12-10: 100 ug via INTRAVENOUS

## 2017-12-10 MED ORDER — TETANUS-DIPHTH-ACELL PERTUSSIS 5-2.5-18.5 LF-MCG/0.5 IM SUSP: 0.5000 mL | Freq: Once | INTRAMUSCULAR | Status: DC

## 2017-12-10 MED ORDER — PHENYLEPHRINE 8 MG IN D5W 100 ML (0.08MG/ML) PREMIX OPTIME
INJECTION | INTRAVENOUS | Status: AC
  Filled 2017-12-10: qty 100

## 2017-12-10 SURGICAL SUPPLY — 31 items
BARRIER ADHS 3X4 INTERCEED (GAUZE/BANDAGES/DRESSINGS) IMPLANT
CHLORAPREP W/TINT 26ML (MISCELLANEOUS) ×3 IMPLANT
CLAMP CORD UMBIL (MISCELLANEOUS) IMPLANT
CLOTH BEACON ORANGE TIMEOUT ST (SAFETY) ×3 IMPLANT
DRSG OPSITE POSTOP 4X10 (GAUZE/BANDAGES/DRESSINGS) ×3 IMPLANT
ELECT REM PT RETURN 9FT ADLT (ELECTROSURGICAL) ×3
ELECTRODE REM PT RTRN 9FT ADLT (ELECTROSURGICAL) ×1 IMPLANT
EXTRACTOR VACUUM KIWI (MISCELLANEOUS) IMPLANT
GLOVE BIO SURGEON STRL SZ 6.5 (GLOVE) ×2 IMPLANT
GLOVE BIO SURGEONS STRL SZ 6.5 (GLOVE) ×1
GLOVE BIOGEL PI IND STRL 7.0 (GLOVE) ×2 IMPLANT
GLOVE BIOGEL PI INDICATOR 7.0 (GLOVE) ×4
GOWN STRL REUS W/TWL LRG LVL3 (GOWN DISPOSABLE) ×6 IMPLANT
KIT ABG SYR 3ML LUER SLIP (SYRINGE) IMPLANT
NEEDLE HYPO 22GX1.5 SAFETY (NEEDLE) IMPLANT
NEEDLE HYPO 25X5/8 SAFETYGLIDE (NEEDLE) IMPLANT
NS IRRIG 1000ML POUR BTL (IV SOLUTION) ×3 IMPLANT
PACK C SECTION WH (CUSTOM PROCEDURE TRAY) ×3 IMPLANT
PAD ABD 7.5X8 STRL (GAUZE/BANDAGES/DRESSINGS) ×6 IMPLANT
PAD OB MATERNITY 4.3X12.25 (PERSONAL CARE ITEMS) ×3 IMPLANT
PENCIL SMOKE EVAC W/HOLSTER (ELECTROSURGICAL) ×3 IMPLANT
RETRACTOR WND ALEXIS 25 LRG (MISCELLANEOUS) IMPLANT
RTRCTR WOUND ALEXIS 25CM LRG (MISCELLANEOUS)
SUT VIC AB 0 CT1 36 (SUTURE) ×18 IMPLANT
SUT VIC AB 2-0 CT1 27 (SUTURE) ×2
SUT VIC AB 2-0 CT1 TAPERPNT 27 (SUTURE) ×1 IMPLANT
SUT VIC AB 4-0 PS2 27 (SUTURE) ×3 IMPLANT
SYR CONTROL 10ML LL (SYRINGE) IMPLANT
TAPE CLOTH SURG 4X10 WHT LF (GAUZE/BANDAGES/DRESSINGS) ×3 IMPLANT
TOWEL OR 17X24 6PK STRL BLUE (TOWEL DISPOSABLE) ×3 IMPLANT
TRAY FOLEY BAG SILVER LF 14FR (SET/KITS/TRAYS/PACK) IMPLANT

## 2017-12-10 NOTE — Progress Notes (Signed)
CBG 78 @1304  Patient not found in PACU CBG monitor

## 2017-12-10 NOTE — H&P (Signed)
Belinda Blake is a 37 y.o. female presenting for repeat cesarean section [redacted]w[redacted]d G3P2002, 2 previous cesarean sections . OB History    Gravida Para Term Preterm AB Living   3 2 2  0 0 2   SAB TAB Ectopic Multiple Live Births           2      Obstetric Comments   #1 nuchal cord, baby did not tolerate labor #2 scheduled repeat c/s     Past Medical History:  Diagnosis Date  . Gestational diabetes   . HPV in female   . Infection    UTI   Past Surgical History:  Procedure Laterality Date  . CESAREAN SECTION     Family History: family history includes Diabetes in her brother and father. Social History:  reports that she has quit smoking. she has never used smokeless tobacco. She reports that she does not drink alcohol or use drugs.     Maternal Diabetes: Yes:  Diabetes Type:  Diet controlled Genetic Screening: Normal Maternal Ultrasounds/Referrals: Normal Fetal Ultrasounds or other Referrals:  None Maternal Substance Abuse:  No Significant Maternal Medications:  None Significant Maternal Lab Results:  None Other Comments:  None  ROS Maternal Medical History:  Reason for admission: Scheduled repeat cesarean section  Fetal activity: Perceived fetal activity is normal.   Last perceived fetal movement was within the past hour.    Prenatal Complications - Diabetes: gestational. Diabetes is managed by diet.        Blood pressure 93/69, pulse (!) 101, temperature 97.6 F (36.4 C), temperature source Oral, resp. rate 18, height 5' 2.21" (1.58 m), weight 89.4 kg (197 lb 3.2 oz), last menstrual period 03/11/2017, SpO2 100 %. Maternal Exam:  Uterine Assessment: Contraction frequency is rare.   Abdomen: Patient reports no abdominal tenderness. Surgical scars: low transverse.   Estimated fetal weight is 7 lb.    Introitus: not evaluated.   Cervix: not evaluated.   Physical Exam  Constitutional: She appears well-developed. She appears distressed.  Neck: Normal range of  motion. Neck supple.  Cardiovascular: Normal rate.  Respiratory: Effort normal. No respiratory distress.  GI:  gravid  Psychiatric: She has a normal mood and affect. Her behavior is normal.    Prenatal labs: ABO, Rh: --/--/O POS, O POS Performed at [facility], [address] [place], [place] [address]  [phone]) Antibody: NEG (03/15 1015) Rubella: 22.70 (09/18 0000) RPR: Non Reactive (03/15 1015)  HBsAg: Negative (09/18 0000)  HIV: Non Reactive (01/03 0921)  GBS:     Assessment/Plan: [redacted]w[redacted]d Gestational diabetes diet control Scheduled repeat cesarean section. The risks of cesarean section discussed with the patient included but were not limited to: bleeding which may require transfusion or reoperation; infection which may require antibiotics; injury to bowel, bladder, ureters or other surrounding organs; injury to the fetus; need for additional procedures including hysterectomy in the event of a life-threatening hemorrhage; placental abnormalities wth subsequent pregnancies, incisional problems, thromboembolic phenomenon and other postoperative/anesthesia complications. The patient concurred with the proposed plan, giving informed written consent for the procedure.   Patient has been NPO since 0000 she will remain NPO for procedure. Anesthesia and OR aware. Preoperative prophylactic antibiotics and SCDs ordered on call to the OR.  To OR when ready.     Emeterio Reeve 12/10/2017, 10:45 AM

## 2017-12-10 NOTE — Transfer of Care (Signed)
Immediate Anesthesia Transfer of Care Note  Patient: Belinda Blake  Procedure(s) Performed: CESAREAN SECTION (N/A )  Patient Location: PACU  Anesthesia Type:Spinal  Level of Consciousness: awake, alert  and oriented  Airway & Oxygen Therapy: Patient Spontanous Breathing  Post-op Assessment: Report given to RN and Post -op Vital signs reviewed and stable  Post vital signs: Reviewed and stable  Last Vitals:  Vitals:   12/10/17 1007  BP: 93/69  Pulse: (!) 101  Resp: 18  Temp: 36.4 C  SpO2: 100%    Last Pain:  Vitals:   12/10/17 1007  TempSrc: Oral         Complications: No apparent anesthesia complications

## 2017-12-10 NOTE — Progress Notes (Signed)
Fetal Heartrate doppler ranged from 128-140 at 1019-1020am.  Jackelyn Knife, RN 12/10/2017 10:21 AM

## 2017-12-10 NOTE — Anesthesia Preprocedure Evaluation (Signed)
Anesthesia Evaluation  Patient identified by MRN, date of birth, ID band Patient awake    Reviewed: Allergy & Precautions, H&P , NPO status , Patient's Chart, lab work & pertinent test results  Airway Mallampati: II   Neck ROM: full    Dental   Pulmonary former smoker,    breath sounds clear to auscultation       Cardiovascular negative cardio ROS   Rhythm:regular Rate:Normal     Neuro/Psych    GI/Hepatic   Endo/Other  diabetes, Type 2  Renal/GU      Musculoskeletal   Abdominal   Peds  Hematology   Anesthesia Other Findings   Reproductive/Obstetrics (+) Pregnancy                             Anesthesia Physical Anesthesia Plan  ASA: II  Anesthesia Plan: Spinal   Post-op Pain Management:    Induction: Intravenous  PONV Risk Score and Plan: 2 and Ondansetron, Scopolamine patch - Pre-op and Treatment may vary due to age or medical condition  Airway Management Planned: Nasal Cannula  Additional Equipment:   Intra-op Plan:   Post-operative Plan:   Informed Consent: I have reviewed the patients History and Physical, chart, labs and discussed the procedure including the risks, benefits and alternatives for the proposed anesthesia with the patient or authorized representative who has indicated his/her understanding and acceptance.     Plan Discussed with: CRNA, Anesthesiologist and Surgeon  Anesthesia Plan Comments:         Anesthesia Quick Evaluation

## 2017-12-10 NOTE — Op Note (Signed)
Belinda Blake PROCEDURE DATE: 12/10/2017  PREOPERATIVE DIAGNOSES: Intrauterine pregnancy at [redacted]w[redacted]d weeks gestation; previous uterine incision kerr x2  POSTOPERATIVE DIAGNOSES: The same  PROCEDURE: Repeat Low Transverse Cesarean Section  SURGEON:  Woodroe Mode, MD  ASSISTANT:  RNFA  ANESTHESIOLOGY TEAM: Anesthesiologist: Albertha Ghee, MD CRNA: Jonna Munro, CRNA  INDICATIONS: Belinda Blake is a 37 y.o. [id number] at [redacted]w[redacted]d here for cesarean section secondary to the indications listed under preoperative diagnoses; please see preoperative note for further details.  The risks of cesarean section were discussed with the patient including but were not limited to: bleeding which may require transfusion or reoperation; infection which may require antibiotics; injury to bowel, bladder, ureters or other surrounding organs; injury to the fetus; need for additional procedures including hysterectomy in the event of a life-threatening hemorrhage; placental abnormalities wth subsequent pregnancies, incisional problems, thromboembolic phenomenon and other postoperative/anesthesia complications.   The patient concurred with the proposed plan, giving informed written consent for the procedure.    FINDINGS:  Viable female infant in cephalic presentation.  Apgars 9 and 9.  Clear amniotic fluid.  Intact placenta, three vessel cord.  Normal uterus, fallopian tubes and ovaries bilaterally.  ANESTHESIA: Spinal  INTRAVENOUS FLUIDS: 2500 ml   ESTIMATED BLOOD LOSS: 800 ml URINE OUTPUT:  100 ml SPECIMENS: Placenta sent to L&D COMPLICATIONS: None immediate  PROCEDURE IN DETAIL:  The patient preoperatively received intravenous antibiotics and had sequential compression devices applied to her lower extremities.  She was then taken to the operating room where spinal anesthesia was administered and was found to be adequate. She was then placed in a dorsal supine position with a leftward tilt, and prepped and draped in  a sterile manner.  A foley catheter was placed into her bladder and attached to constant gravity.  After an adequate timeout was performed, a Pfannenstiel skin incision was made with scalpel over her preexisting scar and carried through to the underlying layer of fascia. The fascia was incised in the midline, and this incision was extended bilaterally using the Mayo scissors.  Kocher clamps were applied to the superior aspect of the fascial incision and the underlying rectus muscles were dissected off bluntly.  A similar process was carried out on the inferior aspect of the fascial incision. The rectus muscles were separated in the midline bluntly and the peritoneum was entered bluntly. Attention was turned to the lower uterine segment where a low transverse hysterotomy was made with a scalpel and extended bilaterally bluntly.  The infant was successfully delivered, the cord was clamped and cut after one minute, and the infant was handed over to the awaiting neonatology team. Uterine massage was then administered, and the placenta delivered intact with a three-vessel cord. The uterus was then cleared of clots and debris.  The hysterotomy was closed with 0 Vicryl in a running locked fashion, and an imbricating layer was also placed with 0 Vicryl.  .  The pelvis was cleared of all clot and debris. Hemostasis was confirmed on all surfaces.  The peritoneum was closed with a 2-0 Vicryl running stitch. The fascia was then closed using 0 Vicryl in a running fashion. 30 ml of 0.5% Marcaine was injected subcutaneously around the incision.  The skin was closed with a 4-0 Vicryl subcuticular stitch. The patient tolerated the procedure well. Sponge, lap, instrument and needle counts were correct x 3.  She was taken to the recovery room in stable condition.    Woodroe Mode, MD, [place], Faculty  [facility] [employer], [place]

## 2017-12-10 NOTE — Anesthesia Procedure Notes (Signed)
Spinal  Patient location during procedure: OR Start time: 12/10/2017 11:30 AM End time: 12/10/2017 11:32 AM Staffing Anesthesiologist: Albertha Ghee, MD Performed: anesthesiologist  Preanesthetic Checklist Completed: patient identified, surgical consent, pre-op evaluation, timeout performed, IV checked, risks and benefits discussed and monitors and equipment checked Spinal Block Patient position: sitting Prep: DuraPrep Patient monitoring: cardiac monitor, continuous pulse ox and blood pressure Approach: midline Location: L3-4 Injection technique: single-shot Needle Needle type: Pencan  Needle gauge: 24 G Needle length: 9 cm Assessment Sensory level: T10 Additional Notes Functioning IV was confirmed and monitors were applied. Sterile prep and drape, including hand hygiene and sterile gloves were used. The patient was positioned and the spine was prepped. The skin was anesthetized with lidocaine.  Free flow of clear CSF was obtained prior to injecting local anesthetic into the CSF.  The spinal needle aspirated freely following injection.  The needle was carefully withdrawn.  The patient tolerated the procedure well.

## 2017-12-11 LAB — CBC
HEMATOCRIT: 28.8 % — AB (ref 36.0–46.0)
HEMOGLOBIN: 9.4 g/dL — AB (ref 12.0–15.0)
MCH: 28.8 pg (ref 26.0–34.0)
MCHC: 32.6 g/dL (ref 30.0–36.0)
MCV: 88.3 fL (ref 78.0–100.0)
Platelets: 222 10*3/uL (ref 150–400)
RBC: 3.26 MIL/uL — AB (ref 3.87–5.11)
RDW: 14.2 % (ref 11.5–15.5)
WBC: 10.5 10*3/uL (ref 4.0–10.5)

## 2017-12-11 MED ORDER — FERROUS SULFATE 325 (65 FE) MG PO TABS
325.0000 mg | ORAL_TABLET | Freq: Every day | ORAL | Status: DC
  Administered 2017-12-12: 325 mg via ORAL
  Filled 2017-12-11: qty 1

## 2017-12-11 NOTE — Anesthesia Postprocedure Evaluation (Signed)
Anesthesia Post Note  Patient: Belinda Blake  Procedure(s) Performed: CESAREAN SECTION (N/A )     Patient location during evaluation: Mother Baby Anesthesia Type: Spinal Level of consciousness: awake, awake and alert, oriented and patient cooperative Pain management: pain level controlled Vital Signs Assessment: post-procedure vital signs reviewed and stable Respiratory status: spontaneous breathing, nonlabored ventilation and respiratory function stable Cardiovascular status: stable Postop Assessment: no headache, no backache, patient able to bend at knees, no apparent nausea or vomiting and adequate PO intake Anesthetic complications: no    Last Vitals:  Vitals:   12/11/17 0150 12/11/17 0543  BP: (!) 93/54 94/62  Pulse: 79 88  Resp: 18 18  Temp: 37.1 C 37 C  SpO2: 98% 99%    Last Pain:  Vitals:   12/11/17 0543  TempSrc: Oral  PainSc:    Pain Goal:                 Fransico Him Shirlean Schlein

## 2017-12-11 NOTE — Lactation Note (Signed)
This note was copied from a baby's chart. Lactation Consultation Note  Patient Name: Belinda Blake OVFIE'P Date: 12/11/2017 Reason for consult: Initial assessment;Term Breastfeeding consultation services and support information given to patient.  Newborn is 67 hours old and mom reports he is latching easily to breast.  She BF her first two sons for one year.  Instructed to feed baby with any cue and call for assist prn.  Maternal Data Does the patient have breastfeeding experience prior to this delivery?: Yes  Feeding Feeding Type: Breast Fed Length of feed: 30 min  LATCH Score                   Interventions    Lactation Tools Discussed/Used     Consult Status Consult Status: Follow-up Date: 12/12/17 Follow-up type: In-patient    Ave Filter 12/11/2017, 10:49 AM

## 2017-12-11 NOTE — Progress Notes (Signed)
Subjective: Postpartum Day 1: Cesarean Delivery Patient reports incisional pain, tolerating PO and no problems voiding.    Objective: Vital signs in last 24 hours: Temp:  [97.6 F (36.4 C)-98.7 F (37.1 C)] 98.6 F (37 C) (03/17 0543) Pulse Rate:  [73-103] 88 (03/17 0543) Resp:  [9-29] 18 (03/17 0543) BP: (93-119)/(54-84) 94/62 (03/17 0543) SpO2:  [96 %-100 %] 99 % (03/17 0543) Weight:  [197 lb 3.2 oz (89.4 kg)] 197 lb 3.2 oz (89.4 kg) (03/16 1007)  Physical Exam:  General: alert, cooperative, appears stated age and no distress Lochia: appropriate Uterine Fundus: firm Incision: healing well, no significant drainage DVT Evaluation: No evidence of DVT seen on physical exam.  Recent Labs    12/09/17 1015 12/11/17 0618  HGB 10.7* 9.4*  HCT 32.7* 28.8*    Assessment/Plan: Status post Cesarean section. Doing well postoperatively.  Continue current care.  Koren Shiver 12/11/2017, 8:51 AM

## 2017-12-12 ENCOUNTER — Encounter (HOSPITAL_COMMUNITY): Payer: Self-pay | Admitting: *Deleted

## 2017-12-12 DIAGNOSIS — Z98891 History of uterine scar from previous surgery: Secondary | ICD-10-CM

## 2017-12-12 LAB — GLUCOSE, CAPILLARY: GLUCOSE-CAPILLARY: 78 mg/dL (ref 65–99)

## 2017-12-12 LAB — BIRTH TISSUE RECOVERY COLLECTION (PLACENTA DONATION)

## 2017-12-12 MED ORDER — IBUPROFEN 600 MG PO TABS: 600.0000 mg | ORAL_TABLET | Freq: Four times a day (QID) | ORAL | 0 refills | Status: AC

## 2017-12-12 MED ORDER — OXYCODONE-ACETAMINOPHEN 5-325 MG PO TABS: 1.0000 | ORAL_TABLET | ORAL | 0 refills | Status: AC | PRN

## 2017-12-12 MED ORDER — SIMETHICONE 80 MG PO CHEW: 80.0000 mg | CHEWABLE_TABLET | ORAL | 0 refills | Status: AC | PRN

## 2017-12-12 NOTE — Discharge Summary (Addendum)
OB Discharge Summary    Patient Name: Belinda Blake DOB: 19-Jan-1981 MRN: [id number] Date of admission: 12/10/2017  Delivering MD: Woodroe Mode )  Date of discharge: 12/12/2017  Admitting diagnosis: scheduled elective repeat C/S Intrauterine pregnancy: [redacted]w[redacted]d   Secondary diagnosis:  Active Problems:   Patient Active Problem List   Diagnosis Date Noted  . Status post repeat low transverse cesarean section 12/12/2017  . Postpartum state 12/10/2017  . Gestational diabetes mellitus (GDM), antepartum 09/30/2017  . AMA (advanced maternal age) multigravida 341+ unspecified trimester 09/29/2017  . Language barrier affecting health care 07/11/2017  . Supervision of other normal pregnancy, antepartum 06/13/2017  . History of cesarean section 06/13/2017  . Frequent UTI 06/13/2017   Additional problems:  A1GDM, diet controlled     Discharge diagnosis: Term Pregnancy Delivered                                                                                                Post partum procedures: none  Complications: None  Hospital course:  Scheduled C/S   37y.o. yo G3P3003 at 334w1das admitted to the hospital 12/10/2017 for scheduled cesarean section with the following indication:Elective Repeat.  Membrane Rupture Time/Date: 11:53 AM ,12/10/2017   Patient delivered a Viable infant.12/10/2017  Details of operation can be found in separate operative note.  Pateint had an uncomplicated postpartum course.  She is ambulating, tolerating a regular diet, passing flatus, and urinating well. Patient is discharged home in stable condition on  12/12/17        Physical exam  Vitals:   12/11/17 1819 12/12/17 0500  BP: (!) 95/57 (!) 95/57  Pulse: 82 77  Resp: 17 18  Temp: 97.9 F (36.6 C) 97.9 F (36.6 C)  SpO2: 98% 96%   General: alert, cooperative and no distress Lochia: appropriate Uterine Fundus: firm Incision: Dressing is clean, dry, and intact DVT Evaluation: No evidence of DVT seen on  physical exam.  Labs: Results for orders placed or performed during the hospital encounter of 12/10/17 (from the past 24 hour(s))  Collect bld for placenta donatation     Status: None   Collection Time: 12/12/17  6:07 AM  Result Value Ref Range   Placenta donation bld collect COLLECTED BY LABORATORY    Discharge instruction: per After Visit Summary and "Baby and Me Booklet".  After visit meds:  No Known Allergies  Allergies as of 12/12/2017   No Known Allergies     Medication List    STOP taking these medications   ACCU-CHEK FASTCLIX LANCETS Misc   ACCU-CHEK NANO SMARTVIEW w/Device Kit   acetaminophen 500 MG tablet Commonly known as:  TYLENOL   cyclobenzaprine 10 MG tablet Commonly known as:  FLEXERIL   glucose blood test strip Commonly known as:  ACCU-CHEK SMARTVIEW     TAKE these medications   ferrous sulfate 325 (65 FE) MG tablet Commonly known as:  FERROUSUL Take 1 tablet (325 mg total) by mouth 3 (three) times daily with meals.   ibuprofen 600 MG tablet Commonly known as:  ADVIL,MOTRIN Take 1 tablet (600 mg total) by mouth  every 6 (six) hours.   oxyCODONE-acetaminophen 5-325 MG tablet Commonly known as:  PERCOCET/ROXICET Take 1-2 tablets by mouth every 4 (four) hours as needed for severe pain.   Prenatal Vitamins 0.8 MG tablet Take 1 tablet by mouth daily.   simethicone 80 MG chewable tablet Commonly known as:  MYLICON Chew 1 tablet (80 mg total) by mouth as needed for flatulence.   Vitamin D (Ergocalciferol) 50000 units Caps capsule Commonly known as:  DRISDOL Take 1 capsule (50,000 Units total) by mouth every 7 (seven) days.      Diet: routine diet  Activity: Advance as tolerated. Pelvic rest for 6 weeks.   Outpatient follow up: 2 week for incision check, 4 weeks postpartum  Postpartum contraception: Undecided  Newborn Data: APGAR (1 MIN): 10   APGAR (5 MINS): 10    Baby Feeding: Breast Disposition:home with mother  Belinda Percy,  DO 12/12/2017   OB FELLOW DISCHARGE ATTESTATION  I have seen and examined this patient and agree with above documentation in the resident's note.   Gailen Shelter, MD OB Fellow

## 2017-12-12 NOTE — Discharge Instructions (Signed)

## 2017-12-22 ENCOUNTER — Ambulatory Visit: Payer: Medicaid Other | Admitting: Family Medicine

## 2017-12-30 ENCOUNTER — Encounter: Payer: Self-pay | Admitting: Family Medicine

## 2017-12-30 ENCOUNTER — Ambulatory Visit: Payer: Medicaid Other | Admitting: Family Medicine

## 2017-12-30 VITALS — BP 113/54 | HR 85 | Wt 178.0 lb

## 2017-12-30 DIAGNOSIS — Z5189 Encounter for other specified aftercare: Secondary | ICD-10-CM

## 2017-12-30 MED ORDER — CEPHALEXIN 500 MG PO CAPS: 500.0000 mg | ORAL_CAPSULE | Freq: Three times a day (TID) | ORAL | 0 refills | Status: AC

## 2017-12-30 NOTE — Progress Notes (Signed)
   Subjective:    Patient ID: Belinda Blake, female    DOB: 04/05/1981, 37 y.o.   MRN: [id number]  HPI  Patient seen for incision check. Feels that left side has opened up. No bleeding or pain. No discharge from area.  Review of Systems     Objective:   Physical Exam  Constitutional: She appears well-developed and well-nourished.  HENT:  Head: Normocephalic and atraumatic.  Abdominal: Soft. There is no tenderness. There is no rebound and no guarding.  Left corner of incision erythemic. No drainage. Still intact.       Assessment & Plan:  1. Visit for wound check Keflex TID x7 days. F/u in 1 week

## 2017-12-30 NOTE — Progress Notes (Signed)
Patient complaining of left side of incision feeling open. Denies fever but states she has had chills. Kathrene Alu RNBSN

## 2018-01-10 ENCOUNTER — Encounter: Payer: Self-pay | Admitting: Nurse Practitioner

## 2018-01-10 ENCOUNTER — Ambulatory Visit (INDEPENDENT_AMBULATORY_CARE_PROVIDER_SITE_OTHER): Payer: Medicaid Other | Admitting: Nurse Practitioner

## 2018-01-10 DIAGNOSIS — Z30011 Encounter for initial prescription of contraceptive pills: Secondary | ICD-10-CM

## 2018-01-10 MED ORDER — MEDROXYPROGESTERONE ACETATE 150 MG/ML IM SUSP
150.0000 mg | Freq: Once | INTRAMUSCULAR | Status: AC
  Administered 2018-04-06: 150 mg via INTRAMUSCULAR

## 2018-01-10 NOTE — Progress Notes (Signed)
Post Partum Exam  Belinda Blake is a 37 y.o. G33P3003 female who presents for a postpartum visit. She is 4 weeks postpartum following a low cervical transverse Cesarean section. I have fully reviewed the prenatal and intrapartum course, and the note from her visit last week. The delivery was at 61 gestational weeks.  Anesthesia: epidural. Postpartum course has been unremarkable. Baby's course has been unremarkable. Baby is feeding by breast. Bleeding no bleeding. Bowel function is normal. Bladder function is normal. Patient is not sexually active. Contraception method is none. Postpartum depression screening:neg  Baby is with her today and is content and growing well.  The following portions of the patient's history were reviewed and updated as appropriate: allergies, current medications, past family history, past medical history, past social history, past surgical history and problem list. Last pap smear done 06-13-17 and was Normal  Review of Systems Pertinent items noted in HPI and remainder of comprehensive ROS otherwise negative.    Objective:  Blood pressure 117/79, pulse (!) 101, weight 178 lb (80.7 kg), currently breastfeeding.  General:  alert, cooperative and no distress   Breasts:  exam deferred - client reports not problems with breastfeeding  Lungs: clear to auscultation bilaterally  Heart:  regular rate and rhythm, S1, S2 normal, no murmur, click, rub or gallop  Abdomen: soft, non-tender; bowel sounds normal; no masses,  no organomegaly and C/S scar well healed, not open, no abnormalities, but still more tender on left side.  Normal healing process.  Pelvic exam  - deferred     Thyroid - normal  Assessment:   Normal postpartum exam. Pap smear not done at today's visit.   Plan:   1. Contraception: Depo-Provera injections 2. Prescription sent to client's pharmacy and client will return with medication on Thursday for injection.  Also on Thursday will have 2 hour glucola due to  history of gestational diabetes. 3. Follow up in: 2 days or as needed.  4.  Will need annual exam in one year and will return for Depo injections every 3 months - does not want any more children.

## 2018-01-12 ENCOUNTER — Telehealth: Payer: Self-pay

## 2018-01-12 ENCOUNTER — Other Ambulatory Visit: Payer: Medicaid Other

## 2018-01-12 ENCOUNTER — Encounter: Payer: Self-pay | Admitting: Nurse Practitioner

## 2018-01-12 DIAGNOSIS — Z30013 Encounter for initial prescription of injectable contraceptive: Secondary | ICD-10-CM

## 2018-01-12 MED ORDER — MEDROXYPROGESTERONE ACETATE 150 MG/ML IM SUSP: 150.0000 mg | INTRAMUSCULAR | 0 refills | Status: DC

## 2018-01-12 NOTE — Telephone Encounter (Signed)
PT called stating that her Depo was not received by the pharmacy. I checked the order Earlie Server, NP put in and it did not go thorough. Fixing the order. Pt is aware the Depo should be at pharmacy today.m

## 2018-01-19 ENCOUNTER — Other Ambulatory Visit (INDEPENDENT_AMBULATORY_CARE_PROVIDER_SITE_OTHER): Payer: Medicaid Other

## 2018-01-19 DIAGNOSIS — Z01812 Encounter for preprocedural laboratory examination: Secondary | ICD-10-CM

## 2018-01-19 DIAGNOSIS — Z3042 Encounter for surveillance of injectable contraceptive: Secondary | ICD-10-CM

## 2018-01-19 DIAGNOSIS — Z30013 Encounter for initial prescription of injectable contraceptive: Secondary | ICD-10-CM

## 2018-01-19 DIAGNOSIS — Z3202 Encounter for pregnancy test, result negative: Secondary | ICD-10-CM | POA: Diagnosis not present

## 2018-01-19 DIAGNOSIS — Z8632 Personal history of gestational diabetes: Secondary | ICD-10-CM

## 2018-01-19 LAB — POCT URINE PREGNANCY: PREG TEST UR: NEGATIVE

## 2018-01-19 MED ORDER — MEDROXYPROGESTERONE ACETATE 150 MG/ML IM SUSP
150.0000 mg | Freq: Once | INTRAMUSCULAR | Status: AC
  Administered 2018-01-19: 150 mg via INTRAMUSCULAR

## 2018-01-19 NOTE — Progress Notes (Signed)
Pt here for Depo and PP 2 hour GTT. Pt states she has not had unprotected intercourse in the past two weeks. Preg test negative. Pt sent to lab for PP 2 hour GTT

## 2018-01-20 ENCOUNTER — Telehealth: Payer: Self-pay

## 2018-01-20 LAB — GLUCOSE TOLERANCE, 2 HOURS
Glucose, 2 hour: 84 mg/dL (ref 65–139)
Glucose, GTT - Fasting: 77 mg/dL (ref 65–99)

## 2018-01-20 NOTE — Telephone Encounter (Signed)
Left message on pt's voicemail letting her know that she passed PP 2 Hour GTT. I also provided office phone number for pt if she has any questions.

## 2018-01-24 ENCOUNTER — Telehealth: Payer: Self-pay

## 2018-01-24 NOTE — Telephone Encounter (Signed)
-----   Message from Lavonia Drafts, MD sent at 01/23/2018  2:41 PM EDT ----- Please call pt. Her PP GTT is WNL.  Thx,  clh-S

## 2018-04-04 ENCOUNTER — Other Ambulatory Visit (HOSPITAL_COMMUNITY)
Admission: RE | Admit: 2018-04-04 | Discharge: 2018-04-04 | Disposition: A | Discharge status: Home / Self Care | Payer: Medicaid Other | Source: Ambulatory Visit | Attending: Advanced Practice Midwife | Admitting: Advanced Practice Midwife

## 2018-04-04 ENCOUNTER — Encounter: Payer: Self-pay | Admitting: Advanced Practice Midwife

## 2018-04-04 ENCOUNTER — Ambulatory Visit (INDEPENDENT_AMBULATORY_CARE_PROVIDER_SITE_OTHER): Payer: Medicaid Other | Admitting: Advanced Practice Midwife

## 2018-04-04 VITALS — BP 109/72 | HR 90 | Ht 65.0 in | Wt 178.0 lb

## 2018-04-04 DIAGNOSIS — Z30013 Encounter for initial prescription of injectable contraceptive: Secondary | ICD-10-CM | POA: Diagnosis not present

## 2018-04-04 DIAGNOSIS — N898 Other specified noninflammatory disorders of vagina: Secondary | ICD-10-CM | POA: Insufficient documentation

## 2018-04-04 DIAGNOSIS — F520 Hypoactive sexual desire disorder: Secondary | ICD-10-CM | POA: Diagnosis not present

## 2018-04-04 DIAGNOSIS — B3731 Acute candidiasis of vulva and vagina: Secondary | ICD-10-CM

## 2018-04-04 DIAGNOSIS — B373 Candidiasis of vulva and vagina: Secondary | ICD-10-CM | POA: Diagnosis not present

## 2018-04-04 MED ORDER — MEDROXYPROGESTERONE ACETATE 150 MG/ML IM SUSP: 150.0000 mg | INTRAMUSCULAR | 0 refills | Status: DC

## 2018-04-04 MED ORDER — TERCONAZOLE 0.4 % VA CREA: 1.0000 | TOPICAL_CREAM | Freq: Every day | VAGINAL | 0 refills | Status: AC

## 2018-04-04 NOTE — Progress Notes (Signed)
   Subjective:    Patient ID: Belinda Blake, female    DOB: July 16, 1981, 37 y.o.   MRN: [id number]   This is a 37 y.o. female who is 4 months post-Cesarean who presents with c/o vaginal itching and irritation.  Notices it more with intercourse. Also relates lowered sexual desire and response.  Has been using DepoProvera for contraception   No menses  Vaginal Discharge  The patient's primary symptoms include genital itching and vaginal discharge. The patient's pertinent negatives include no genital lesions, genital odor, pelvic pain or vaginal bleeding. This is a new problem. The current episode started in the past 7 days. The problem has been unchanged. The problem affects both sides. She is not pregnant. Pertinent negatives include no abdominal pain, back pain, chills, constipation, diarrhea, dysuria, fever, frequency or headaches. The vaginal discharge was normal. There has been no bleeding. She has not been passing clots. She has not been passing tissue. The symptoms are aggravated by intercourse. She has tried nothing for the symptoms. She is sexually active. No, her partner does not have an STD. She uses progestin injections for contraception.    Review of Systems  Constitutional: Negative for chills and fever.  Gastrointestinal: Negative for abdominal pain, constipation and diarrhea.  Genitourinary: Positive for vaginal discharge. Negative for dysuria, frequency and pelvic pain.  Musculoskeletal: Negative for back pain.  Neurological: Negative for headaches.       Objective:   Physical Exam  Constitutional: She is oriented to person, place, and time. She appears well-developed and well-nourished. No distress.  HENT:  Head: Normocephalic.  Cardiovascular: Normal rate.  Pulmonary/Chest: No respiratory distress.  Abdominal: Soft. She exhibits no distension and no mass. There is no tenderness. There is no rebound and no guarding.  Genitourinary: Vagina normal and uterus normal. No vaginal  discharge found.  Genitourinary Comments: No lesions or erethema.  Some dryness  Musculoskeletal: Normal range of motion.  Neurological: She is alert and oriented to person, place, and time.  Skin: Skin is warm and dry.  Psychiatric: She has a normal mood and affect.          Assessment & Plan:  Vaginal itching Vaginal dryness Dyspareunia Lowered sexual desire/response  Suspect some or all of these may be related to recent postpartum status and DepoProvera Discussed hormonal contribution to sexual response Also discussed stress and sleep disturbance  Vaginal swab sent Rx Terazol 7 for presumed candida  Discussed relaxation and foreplay Discussed setting aside time and possibly getting babysitter at times  Followup as indicated 50% of time spent in counseling and recommendations

## 2018-04-04 NOTE — Patient Instructions (Signed)

## 2018-04-04 NOTE — Progress Notes (Signed)
Vaginal itching with discharge with white. Patient also complaining of increasing pain with intercourse.

## 2018-04-05 LAB — CERVICOVAGINAL ANCILLARY ONLY
Bacterial vaginitis: POSITIVE — AB
Candida vaginitis: POSITIVE — AB
Trichomonas: NEGATIVE

## 2018-04-06 ENCOUNTER — Other Ambulatory Visit (INDEPENDENT_AMBULATORY_CARE_PROVIDER_SITE_OTHER): Payer: Medicaid Other

## 2018-04-06 ENCOUNTER — Other Ambulatory Visit: Payer: Self-pay | Admitting: Advanced Practice Midwife

## 2018-04-06 DIAGNOSIS — Z3042 Encounter for surveillance of injectable contraceptive: Secondary | ICD-10-CM | POA: Diagnosis not present

## 2018-04-06 MED ORDER — METRONIDAZOLE 500 MG PO TABS: 500.0000 mg | ORAL_TABLET | Freq: Two times a day (BID) | ORAL | 0 refills | Status: AC

## 2018-04-06 NOTE — Progress Notes (Signed)
BV per culture Rx sent for flagyl  Already treated for yeast  Belinda Blake, CNM

## 2018-04-06 NOTE — Progress Notes (Addendum)
Patient present for Depo Provera injection.  PATIENT SUPPLIED Medication. Patient made aware of results from Tuesday visit and that she has bacterial vaginosis. Patient made aware that Flagyl has been called in for her to her pharmacy per Hansel Feinstein, CNM.  Kathrene Alu RN

## 2018-06-13 ENCOUNTER — Other Ambulatory Visit: Payer: Self-pay

## 2018-06-13 DIAGNOSIS — Z30013 Encounter for initial prescription of injectable contraceptive: Secondary | ICD-10-CM

## 2018-06-13 MED ORDER — MEDROXYPROGESTERONE ACETATE 150 MG/ML IM SUSP: 150.0000 mg | INTRAMUSCULAR | 3 refills | Status: AC

## 2018-07-05 ENCOUNTER — Ambulatory Visit (INDEPENDENT_AMBULATORY_CARE_PROVIDER_SITE_OTHER): Payer: Medicaid Other

## 2018-07-05 VITALS — BP 106/76 | HR 97 | Wt 170.0 lb

## 2018-07-05 DIAGNOSIS — Z3042 Encounter for surveillance of injectable contraceptive: Secondary | ICD-10-CM

## 2018-07-05 MED ORDER — MEDROXYPROGESTERONE ACETATE 150 MG/ML IM SUSP
150.0000 mg | Freq: Once | INTRAMUSCULAR | Status: AC
  Administered 2018-07-05: 150 mg via INTRAMUSCULAR

## 2018-07-05 NOTE — Progress Notes (Addendum)
Belinda Blake here for Depo-Provera  Injection.  Injection administered without complication. Patient will return in 3 months for next injection.  Shafiq Larch l Vennie Waymire, CMA 07/05/2018  9:53 AM   Attestation of Attending Supervision of RN: Evaluation and management procedures were performed by the nurse under my supervision and collaboration.  I have reviewed the nursing note and chart, and I agree with the management and plan.  Carolyn L. Harraway-Smith, M.D., Cherlynn June

## 2018-09-25 ENCOUNTER — Ambulatory Visit (INDEPENDENT_AMBULATORY_CARE_PROVIDER_SITE_OTHER): Payer: Medicaid Other

## 2018-09-25 ENCOUNTER — Other Ambulatory Visit (HOSPITAL_COMMUNITY)
Admission: RE | Admit: 2018-09-25 | Discharge: 2018-09-25 | Disposition: A | Discharge status: Home / Self Care | Payer: Medicaid Other | Source: Ambulatory Visit | Attending: Obstetrics & Gynecology | Admitting: Obstetrics & Gynecology

## 2018-09-25 VITALS — BP 103/62 | HR 82 | Wt 170.0 lb

## 2018-09-25 DIAGNOSIS — Z3042 Encounter for surveillance of injectable contraceptive: Secondary | ICD-10-CM | POA: Diagnosis not present

## 2018-09-25 DIAGNOSIS — N949 Unspecified condition associated with female genital organs and menstrual cycle: Secondary | ICD-10-CM

## 2018-09-25 MED ORDER — MEDROXYPROGESTERONE ACETATE 150 MG/ML IM SUSP
150.0000 mg | Freq: Once | INTRAMUSCULAR | Status: AC
  Administered 2018-09-25: 150 mg via INTRAMUSCULAR

## 2018-09-25 MED ORDER — MEDROXYPROGESTERONE ACETATE 150 MG/ML IM SUSP: 150.0000 mg | INTRAMUSCULAR | 2 refills | Status: AC

## 2018-09-25 NOTE — Progress Notes (Addendum)
Patient states she was seen recently at [facility] urgent care for bacterial vaginosis (patient had been complaining of vaginal burning). Patient was treated approx 2 weeks ago per patient.   Patient desires to be rechecked for this because she has notice some vaginal burning again. Patient given instructions for self swab and collected sample.   Patient also is in office for depo provera injection. Patient last injection on 07/05/18. Patient told to return to office between 03/17-31/2020.\  Attestation of Attending Supervision of RN: Evaluation and management procedures were performed by the nurse under my supervision and collaboration.  I have reviewed the nursing note and chart, and I agree with the management and plan.  Carolyn L. Harraway-Smith, M.D., Cherlynn June

## 2018-09-29 LAB — CERVICOVAGINAL ANCILLARY ONLY
Bacterial vaginitis: POSITIVE — AB
CHLAMYDIA, DNA PROBE: NEGATIVE
Candida vaginitis: POSITIVE — AB
NEISSERIA GONORRHEA: NEGATIVE

## 2018-10-02 ENCOUNTER — Telehealth: Payer: Self-pay

## 2018-10-02 ENCOUNTER — Other Ambulatory Visit: Payer: Self-pay | Admitting: Obstetrics & Gynecology

## 2018-10-02 MED ORDER — METRONIDAZOLE 500 MG PO TABS: 500.0000 mg | ORAL_TABLET | Freq: Two times a day (BID) | ORAL | 0 refills | Status: AC

## 2018-10-02 MED ORDER — FLUCONAZOLE 150 MG PO TABS: 150.0000 mg | ORAL_TABLET | Freq: Once | ORAL | 3 refills | Status: AC

## 2018-10-02 NOTE — Addendum Note (Signed)
Addended by: Lavonia Drafts on: 10/02/2018 12:03 PM   Modules accepted: Orders

## 2018-10-02 NOTE — Telephone Encounter (Signed)
Pt called the office for results. Informed pt that she has BV and a yeast infection. Advised pt to take the Metronidazole first then take the Fluconazole after she is done with the Metronidazole. Understanding was voiced.

## 2018-10-02 NOTE — Telephone Encounter (Signed)
Called pt with [facility] [phone]. Left message for pt to call the office back.

## 2018-12-12 ENCOUNTER — Ambulatory Visit: Payer: Medicaid Other

## 2018-12-19 IMAGING — US US MFM OB DETAIL+14 WK
1 series · 13 of 28 positions shown · non-contrast
Comparison: none

[Series 1: us mfm ob detail+14 wk · 13 of 78 slices shown]
[im 3/78]
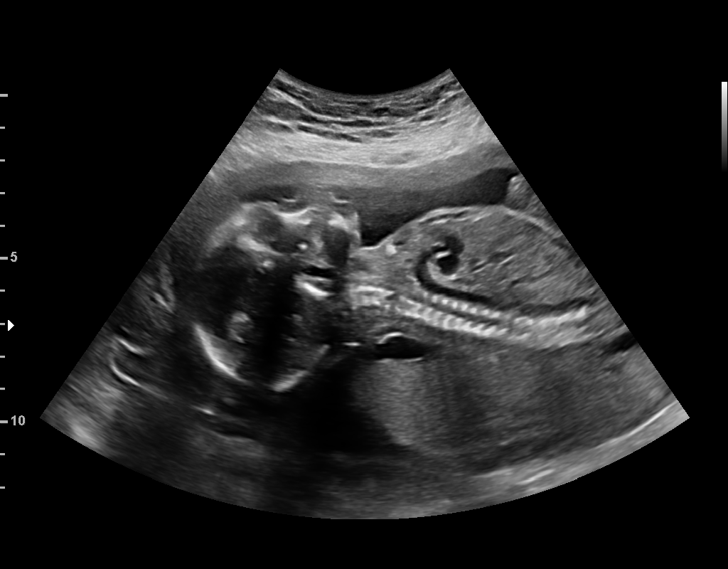
[im 9/78]
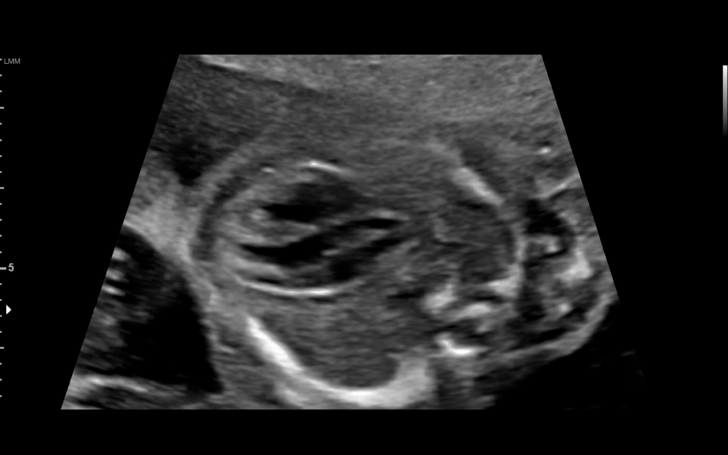
[im 15/78]
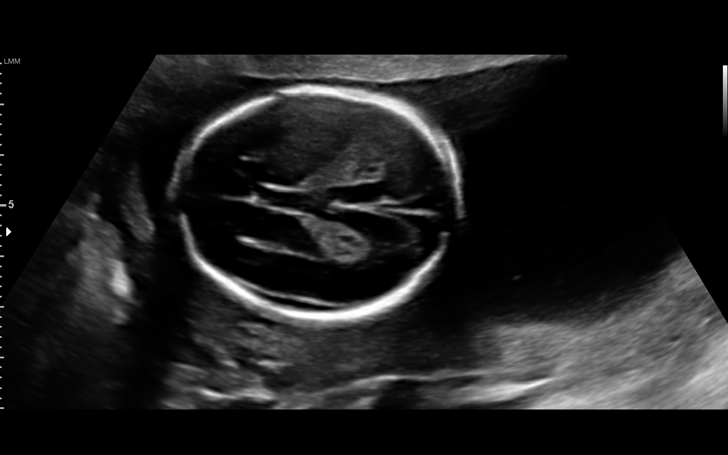
[im 20/78]
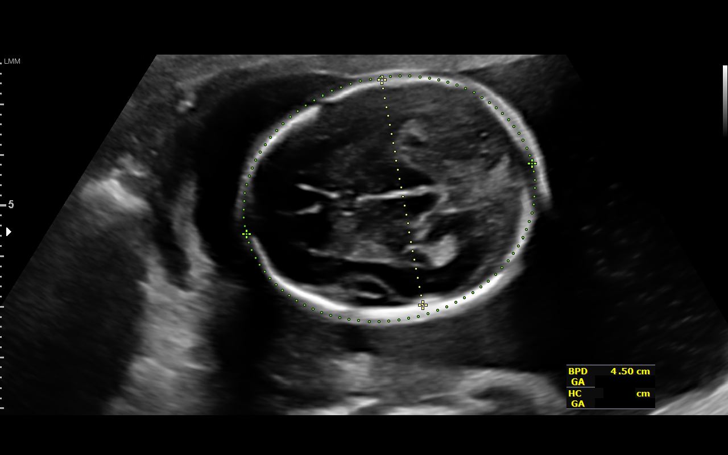
[im 26/78]
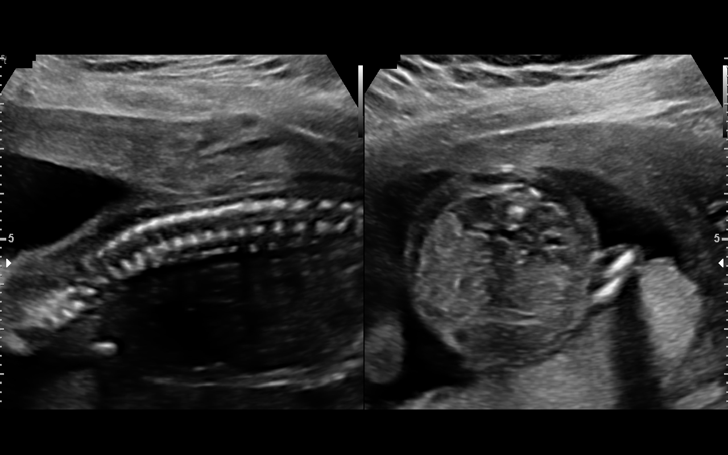
[im 32/78]
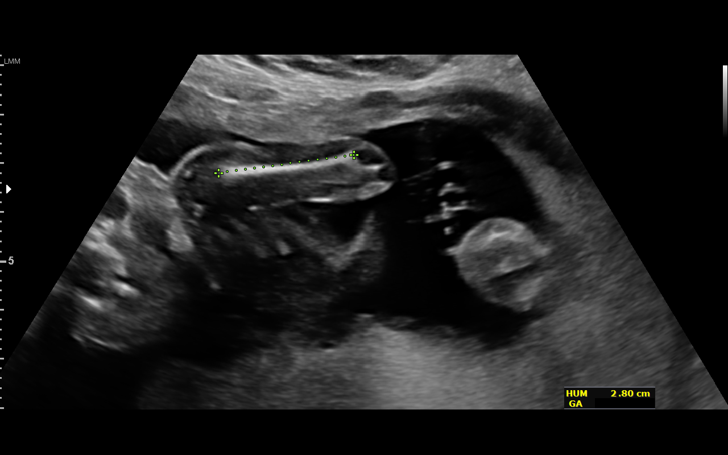
[im 40/78]
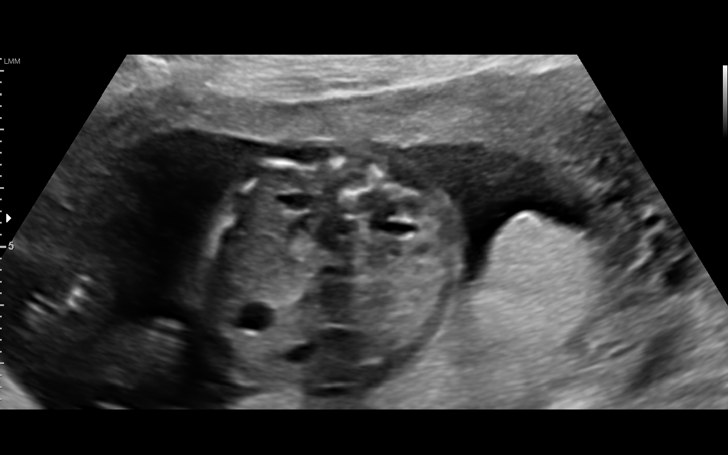
[im 46/78]
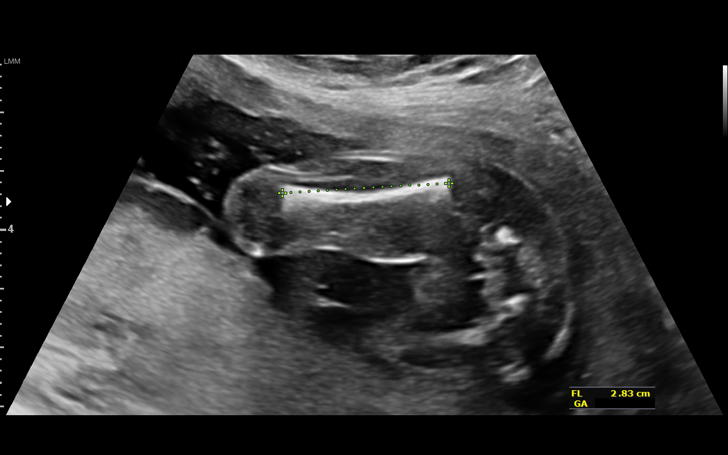
[im 52/78]
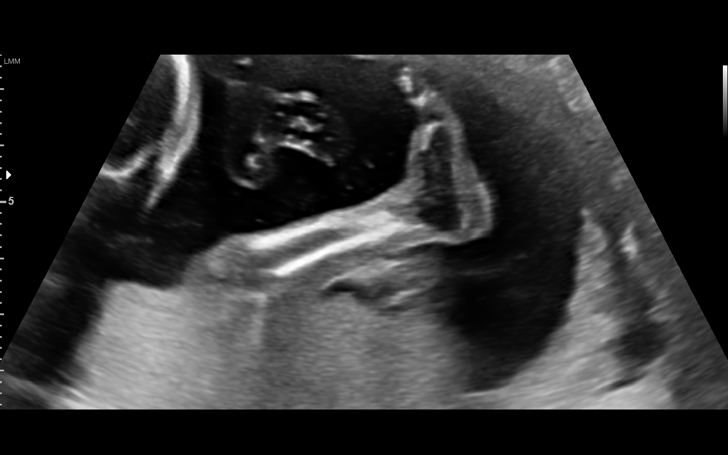
[im 58/78]
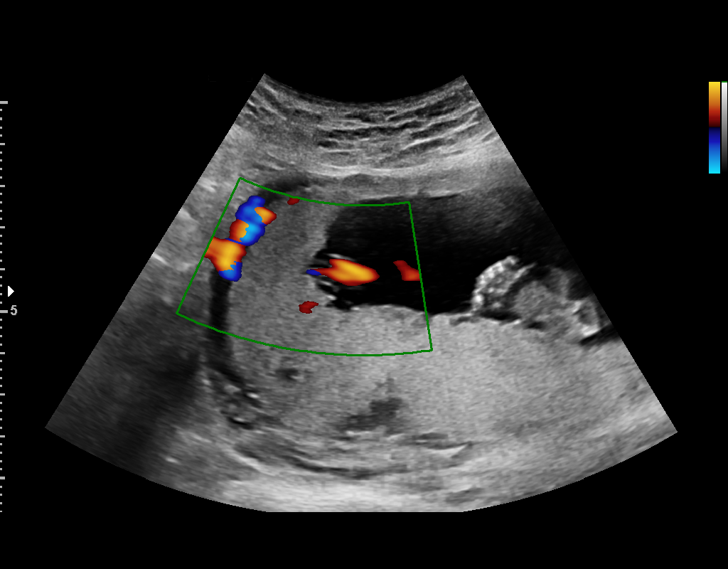
[im 63/78]
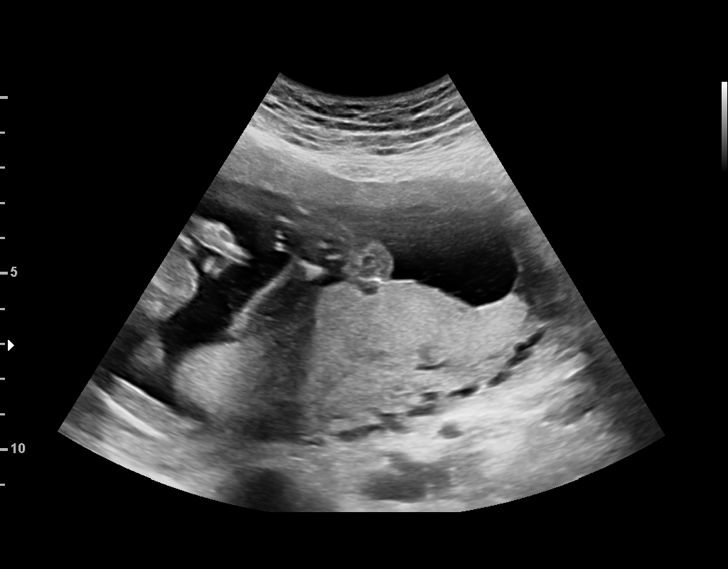
[im 69/78]
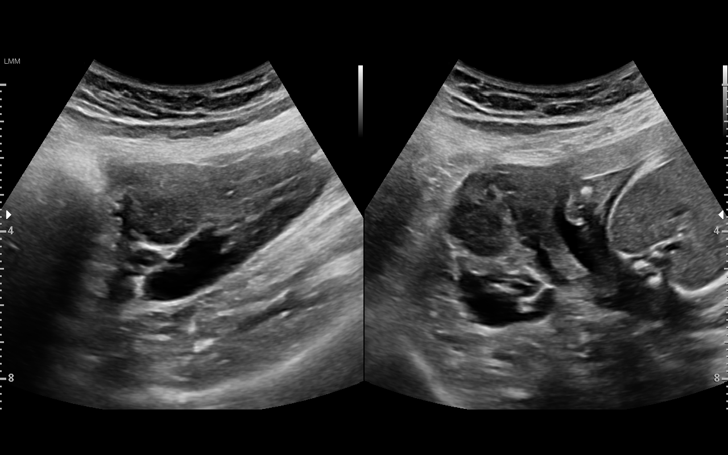
[im 75/78]
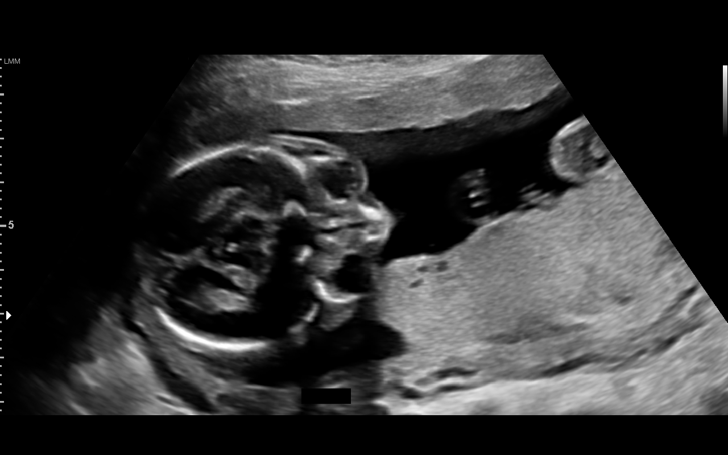

[13 of 28 positions shown; findings below may reference images not displayed]

Indications

18 weeks gestation of pregnancy
Encounter for fetal anatomic survey
Advanced maternal age multigravida 35+,
second trimester (low risk Panorama)
Previous cesarean delivery x 2, antepartum
OB History

Gravidity:    3         Term:   2
Living:       2
Fetal Evaluation

Num Of Fetuses:     1
Fetal Heart         138
Rate(bpm):
Cardiac Activity:   Observed
Presentation:       Transverse, head to maternal right
Placenta:           Posterior, above cervical os
P. Cord Insertion:  Visualized, central

Amniotic Fluid
AFI FV:      Subjectively within normal limits

Largest Pocket(cm)
4.27
Biometry

BPD:      44.9  mm     G. Age:  19w 4d         84  %    CI:        73.58   %    70 - 86
FL/HC:      16.8   %    16.1 -
HC:      166.3  mm     G. Age:  19w 2d         72  %    HC/AC:      1.14        1.09 -
AC:      145.9  mm     G. Age:  19w 6d         82  %    FL/BPD:     62.4   %
FL:         28  mm     G. Age:  18w 4d         39  %    FL/AC:      19.2   %    20 - 24
HUM:      27.6  mm     G. Age:  18w 6d         55  %
CER:      20.5  mm     G. Age:  19w 4d         71  %

CM:        4.2  mm
Est. FW:     285  gm    0 lb 10 oz      55  %
Gestational Age

LMP:           18w 5d        Date:  03/11/17                 EDD:   12/16/17
U/S Today:     19w 2d                                        EDD:   12/12/17
Best:          18w 5d     Det. By:  LMP  (03/11/17)          EDD:   12/16/17
Anatomy

Cranium:               Appears normal         Aortic Arch:            Appears normal
Cavum:                 Appears normal         Ductal Arch:            Appears normal
Ventricles:            Appears normal         Diaphragm:              Appears normal
Choroid Plexus:        Appears normal         Stomach:                Appears normal, left
sided
Cerebellum:            Appears normal         Abdomen:                Appears normal
Posterior Fossa:       Appears normal         Abdominal Wall:         Appears nml (cord
insert, abd wall)
Nuchal Fold:           Appears normal         Cord Vessels:           Appears normal (3
vessel cord)
Face:                  Orbits appear          Kidneys:                Appear normal
normal
Lips:                  Appears normal         Bladder:                Appears normal
Thoracic:              Appears normal         Spine:                  Appears normal
Heart:                 Appears normal         Upper Extremities:      Appears normal
(4CH, axis, and situs
RVOT:                  Appears normal         Lower Extremities:      Appears normal
LVOT:                  Appears normal

Other:  Male gender. Heels visualized. Right hand not well visualized due to
fetal position.
Cervix Uterus Adnexa

Cervix
Length:            4.8  cm.
Normal appearance by transabdominal scan.

Uterus
Single fibroid noted, see table below.

Left Ovary
Within normal limits.

Right Ovary
Within normal limits.

Cul De Sac:   No free fluid seen.

Adnexa:       No abnormality visualized.
Myomas

Site                     L(cm)      W(cm)      D(cm)      Location
LUS Anterior             3.3        3.7        2.6        Intramural
Blood Flow                 RI        PI       Comments

Impression

SIUP at 19+2 weeks
Normal detailed fetal anatomy; limited views of profile
Markers of aneuploidy: none
Normal amniotic fluid volume
Measurements consistent with LMP dating
Fibroid uterus: see above for size and location
Recommendations

Follow-up ultrasound for growth in third trimester

## 2019-03-20 ENCOUNTER — Other Ambulatory Visit: Payer: Self-pay

## 2019-03-20 ENCOUNTER — Emergency Department (HOSPITAL_COMMUNITY)
Admission: EM | Admit: 2019-03-20 | Discharge: 2019-03-20 | Disposition: A | Discharge status: Home / Self Care | Payer: Medicaid Other | Attending: Emergency Medicine | Admitting: Emergency Medicine

## 2019-03-20 ENCOUNTER — Encounter (HOSPITAL_COMMUNITY): Payer: Self-pay | Admitting: Family Medicine

## 2019-03-20 ENCOUNTER — Ambulatory Visit: Payer: Medicaid Other | Admitting: Advanced Practice Midwife

## 2019-03-20 DIAGNOSIS — M5412 Radiculopathy, cervical region: Secondary | ICD-10-CM

## 2019-03-20 DIAGNOSIS — Z79899 Other long term (current) drug therapy: Secondary | ICD-10-CM | POA: Insufficient documentation

## 2019-03-20 DIAGNOSIS — Z87891 Personal history of nicotine dependence: Secondary | ICD-10-CM | POA: Insufficient documentation

## 2019-03-20 DIAGNOSIS — M5489 Other dorsalgia: Secondary | ICD-10-CM | POA: Diagnosis present

## 2019-03-20 MED ORDER — KETOROLAC TROMETHAMINE 30 MG/ML IJ SOLN
30.0000 mg | Freq: Once | INTRAMUSCULAR | Status: AC
  Administered 2019-03-20: 30 mg via INTRAVENOUS
  Filled 2019-03-20: qty 1

## 2019-03-20 MED ORDER — HYDROCODONE-ACETAMINOPHEN 5-325 MG PO TABS: 1.0000 | ORAL_TABLET | ORAL | 0 refills | Status: AC | PRN

## 2019-03-20 MED ORDER — DIAZEPAM 5 MG PO TABS: 5.0000 mg | ORAL_TABLET | Freq: Two times a day (BID) | ORAL | 0 refills | Status: AC | PRN

## 2019-03-20 MED ORDER — PREDNISONE 10 MG PO TABS: ORAL_TABLET | ORAL | 0 refills | Status: DC

## 2019-03-20 MED ORDER — PREDNISONE 10 MG PO TABS: ORAL_TABLET | ORAL | 0 refills | Status: AC

## 2019-03-20 MED ORDER — HYDROMORPHONE HCL 1 MG/ML IJ SOLN
0.5000 mg | Freq: Once | INTRAMUSCULAR | Status: AC
  Administered 2019-03-20: 07:00:00 0.5 mg via INTRAVENOUS
  Filled 2019-03-20: qty 1

## 2019-03-20 MED ORDER — DIAZEPAM 5 MG PO TABS: 5.0000 mg | ORAL_TABLET | Freq: Two times a day (BID) | ORAL | 0 refills | Status: AC

## 2019-03-20 NOTE — ED Provider Notes (Signed)
[place] DEPT Provider Note   CSN: [id number] Arrival date & time: 03/20/19  0056     History   Chief Complaint Chief Complaint  Patient presents with  . Back Pain    HPI Belinda Blake is a 38 y.o. female.     Patient to ED with complaint of pain in the left neck and shoulder for the last several days, worsening tonight after going to bed. She reports a history of cervical radiculopathy, diagnosed last year by MRI and followed by [facility]. She was pregnant at the time of diagnosis so surgery was delayed. She has had little problems with pain over the last year but reports she is moving to a new house and feels she over exerted causing recurrent pain. It is located in the left proximal shoulder and is sharp and stabbing. There is no radiation to the UE's. No weakness, numbness or tingling. The pain radiates down the left torso and is made worse by movement and breathing. She denies feeling SOB. No cough, fever. She is felt at low risk for PE. She also reports that these symptoms are identical to last year when diagnosed. No LE pain, urinary/bowel dysfunction.   The history is provided by the patient. No language interpreter was used.    Past Medical History:  Diagnosis Date  . Gestational diabetes   . Gestational diabetes mellitus (GDM), antepartum 09/30/2017  . History of cesarean section 06/13/2017   07/11/2017 Pt considering TOLAC. Rec a formal conversation with pt and interpreter. Needs a formal discussion with Arabic interpreter to discuss risks vs benefits. Pt offered to bring her friend for the discussion I have informed her that this will need to be with a Psychologist, sport and exercise.    08/02/17 - Lengthy discussion of R/B TOLAC after 2 Cesareans with profession interpreter at [facility]. 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 Infection    UTI    Patient Active Problem List   Diagnosis Date Noted  . Decreased sexual desire 04/04/2018  . Language  barrier affecting health care 07/11/2017  . Frequent UTI 06/13/2017    Past Surgical History:  Procedure Laterality Date  . CESAREAN SECTION    . CESAREAN SECTION N/A 12/10/2017   Procedure: CESAREAN SECTION;  Surgeon: Woodroe Mode, MD;  Location: [facility];  Service: Obstetrics;  Laterality: N/A;     OB History    Gravida  3   Para  3   Term  3   Preterm  0   AB  0   Living  3     SAB      TAB      Ectopic      Multiple  0   Live Births  3        Obstetric Comments  #1 nuchal cord, baby did not tolerate labor #2 scheduled repeat c/s         Home Medications    Prior to Admission medications   Medication Sig Start Date End Date Taking? Authorizing Provider  acetaminophen (TYLENOL) 500 MG tablet Take 500 mg by mouth every 6 (six) hours as needed for moderate pain.   Yes [provider]  cephALEXin (KEFLEX) 500 MG capsule Take 1 capsule (500 mg total) by mouth 3 (three) times daily. Patient not taking: Reported on 01/10/2018 12/30/17   Truett Mainland, DO  ferrous sulfate (FERROUSUL) 325 (65 FE) MG tablet Take 1 tablet (325 mg total) by mouth 3 (  three) times daily with meals. Patient not taking: Reported on 12/30/2017 09/30/17   Truett Mainland, DO  ibuprofen (ADVIL,MOTRIN) 600 MG tablet Take 1 tablet (600 mg total) by mouth every 6 (six) hours. Patient not taking: Reported on 03/20/2019 12/12/17   Rory Percy, DO  medroxyPROGESTERone (DEPO-PROVERA) 150 MG/ML injection Inject 1 mL (150 mg total) into the muscle every 3 (three) months. Patient not taking: Reported on 03/20/2019 06/13/18   Seabron Spates, CNM  medroxyPROGESTERone (DEPO-PROVERA) 150 MG/ML injection Inject 1 mL (150 mg total) into the muscle every 3 (three) months. Patient not taking: Reported on 03/20/2019 09/25/18   Lavonia Drafts, MD  metroNIDAZOLE (FLAGYL) 500 MG tablet Take 1 tablet (500 mg total) by mouth 2 (two) times daily. Patient not taking: Reported on  03/20/2019 10/02/18   Lavonia Drafts, MD  oxyCODONE-acetaminophen (PERCOCET/ROXICET) 5-325 MG tablet Take 1-2 tablets by mouth every 4 (four) hours as needed for severe pain. Patient not taking: Reported on 12/30/2017 12/12/17   Rory Percy, DO  Prenatal Multivit-Min-Fe-FA (PRENATAL VITAMINS) 0.8 MG tablet Take 1 tablet by mouth daily. Patient not taking: Reported on 07/05/2018 07/11/17   Lavonia Drafts, MD  simethicone (MYLICON) 80 MG chewable tablet Chew 1 tablet (80 mg total) by mouth as needed for flatulence. Patient not taking: Reported on 12/30/2017 12/12/17   Rory Percy, DO  terconazole (TERAZOL 7) 0.4 % vaginal cream Place 1 applicator vaginally at bedtime. Patient not taking: Reported on 07/05/2018 04/04/18   Seabron Spates, CNM  Vitamin D, Ergocalciferol, (DRISDOL) 50000 units CAPS capsule Take 1 capsule (50,000 Units total) by mouth every 7 (seven) days. Patient not taking: Reported on 03/20/2019 07/11/17   Lavonia Drafts, MD    Family History Family History  Problem Relation Age of Onset  . Diabetes Father   . Diabetes Brother   . Asthma Neg Hx   . Cancer Neg Hx   . Hearing loss Neg Hx   . Hypertension Neg Hx   . Stroke Neg Hx     Social History Social History   Tobacco Use  . Smoking status: Former Research scientist (life sciences)  . Smokeless tobacco: Never Used  . Tobacco comment: occ smoker, social not with preg  Substance Use Topics  . Alcohol use: No  . Drug use: No     Allergies   Patient has no known allergies.   Review of Systems Review of Systems  Constitutional: Negative for chills and fever.  Respiratory: Negative.  Negative for cough and shortness of breath.   Cardiovascular: Negative.  Negative for chest pain.  Gastrointestinal: Negative.  Negative for abdominal pain.  Genitourinary: Negative for enuresis.  Musculoskeletal:       See HPI.  Skin: Negative.  Negative for color change and wound.  Neurological: Negative.  Negative for weakness  and numbness.     Physical Exam Updated Vital Signs BP 116/82 (BP Location: Right Arm)   Pulse 93   Temp 98.4 F (36.9 C) (Oral)   Resp 16   Ht 5' 2.6" (1.59 m)   Wt 73 kg   SpO2 100%   BMI 28.88 kg/m   Physical Exam Vitals signs and nursing note reviewed.  Constitutional:      Appearance: She is well-developed.  HENT:     Head: Normocephalic.  Neck:     Musculoskeletal: Normal range of motion and neck supple.  Cardiovascular:     Rate and Rhythm: Normal rate and regular rhythm.  Pulmonary:     Effort: Pulmonary effort  is normal.     Breath sounds: Normal breath sounds. No wheezing, rhonchi or rales.  Chest:     Chest wall: No tenderness.  Abdominal:     General: Bowel sounds are normal.     Palpations: Abdomen is soft.     Tenderness: There is no abdominal tenderness. There is no guarding or rebound.  Musculoskeletal: Normal range of motion.       Back:     Comments: Tender to palpation on left trapezius that reproduces radiating pain.   Skin:    General: Skin is warm and dry.     Findings: No rash.  Neurological:     General: No focal deficit present.     Mental Status: She is alert and oriented to person, place, and time.     Sensory: No sensory deficit.     Comments: No lateralizing weakness. Reflexes are equal. No deficits of coordination. Ambulatory without difficulty.        ED Treatments / Results  Labs (all labs ordered are listed, but only abnormal results are displayed) Labs Reviewed - No data to display  EKG    Radiology No results found.  Procedures Procedures (including critical care time)  Medications Ordered in ED Medications  ketorolac (TORADOL) 30 MG/ML injection 30 mg (has no administration in time range)     Initial Impression / Assessment and Plan / ED Course  I have reviewed the triage vital signs and the nursing notes.  Pertinent labs & imaging results that were available during my care of the patient were reviewed by  me and considered in my medical decision making (see chart for details).        Patient to ED with symptoms identical to cervical radiculopathy diagnosed last year by MRI.  No neurologic deficits on exam. No LE symptoms, bowel or bladder dysfunction to suggest myelopathy. IV Toradol provided with minimal relief. IV Dilaudid pending.   She has pain with breathing but denies feeling respiratorily compromised. She is PERC negative for PE.   Patient is felt appropriate for discharge home, referred back to Dr. Gerilyn Nestle at [facility] for planned surgery. Will send home on prednisone taper, Norco, Valium. Return precautions discussed.   Patient care signed out to [facility], PA-C, pending re-evaluation for pain control.    Final Clinical Impressions(s) / ED Diagnoses   Final diagnoses:  None   1. Cervical radiculopathy  ED Discharge Orders    None       Charlann Lange, PA-C 03/20/19 [id number]    Ward, Delice Bison, DO 03/23/19 2303

## 2019-03-20 NOTE — Discharge Instructions (Signed)
Take the medications as prescribed.   Please call Dr. Gerilyn Nestle, neurosurgery, to discuss further treatment options for cervical radiculopathy.   Return here with any new or worsening symptoms.

## 2019-03-20 NOTE — ED Notes (Signed)
Pt ambulated from triage to room 16 with no assistance. Gait steady

## 2019-03-20 NOTE — ED Triage Notes (Addendum)
Patient was transported from her home by [facility]. Patient is complaining of left neck pain/shoulder pain that radiates to her lower back. Patient informed EMS she has a history of herniated disc at C5. Patient was lying in bed when it started. Denies numbness, tingling, or loss of bowel/bladder. Denies any recent injury.

## 2019-04-13 ENCOUNTER — Ambulatory Visit: Payer: Medicaid Other | Admitting: Obstetrics & Gynecology

## 2019-04-27 ENCOUNTER — Encounter: Payer: Self-pay | Admitting: Obstetrics & Gynecology

## 2019-04-27 ENCOUNTER — Other Ambulatory Visit: Payer: Self-pay

## 2019-04-27 ENCOUNTER — Ambulatory Visit (INDEPENDENT_AMBULATORY_CARE_PROVIDER_SITE_OTHER): Payer: Medicaid Other | Admitting: Obstetrics & Gynecology

## 2019-04-27 VITALS — BP 108/57 | HR 84 | Ht 66.0 in | Wt 186.4 lb

## 2019-04-27 DIAGNOSIS — Z1239 Encounter for other screening for malignant neoplasm of breast: Secondary | ICD-10-CM

## 2019-04-27 DIAGNOSIS — Z3202 Encounter for pregnancy test, result negative: Secondary | ICD-10-CM | POA: Diagnosis not present

## 2019-04-27 DIAGNOSIS — N912 Amenorrhea, unspecified: Secondary | ICD-10-CM

## 2019-04-27 LAB — POCT URINE PREGNANCY: Preg Test, Ur: NEGATIVE

## 2019-04-27 NOTE — Progress Notes (Signed)
Subjective:     Belinda Blake is a 38 y.o. female here for a routine exam. LMP > 1 year prev on Depo Provera.  Current complaints: amenorrhea.  Pt is s/p depo provera last in Mar 2020. She didn't like it because she was not having cycles on this med. She has been using condoms for contraception.   Pt is s/p c-section on 12/10/2017   Gynecologic History Patient's last menstrual period was 12/10/2018. Contraception: condoms Last Pap: 06/13/2017. Results were: normal Last mammogram: n/a  Obstetric History OB History  Gravida Para Term Preterm AB Living  3 3 3  0 0 3  SAB TAB Ectopic Multiple Live Births        0 3    # Outcome Date GA Lbr Len/2nd Weight Sex Delivery Anes PTL Lv  3 Term 12/10/17 [redacted]w[redacted]d  7 lb 5.3 oz (3.325 kg) M CS-LTranv Spinal  LIV  2 Term 2009 [redacted]w[redacted]d  9 lb 4.2 oz (4.2 kg) M CS-Unspec  N LIV  1 Term 2005   9 lb 3 oz (4.167 kg) M CS-Unspec  N LIV     Complications: Fetal Intolerance, Failure to Progress in First Stage, Nuchal cord affecting delivery    Obstetric Comments  #1 nuchal cord, baby did not tolerate labor  #2 scheduled repeat c/s   Review of Systems Pertinent items are noted in HPI.    Objective:  BP (!) 108/57   Pulse 84   Ht 5\' 6"  (1.676 m)   Wt 186 lb 6.4 oz (84.6 kg)   LMP 12/10/2018   BMI 30.09 kg/m    CONSTITUTIONAL: Well-developed, well-nourished female in no acute distress.  HENT:  Normocephalic, atraumatic EYES: Conjunctivae and EOM are normal. No scleral icterus.  NECK: Normal range of motion SKIN: Skin is warm and dry. No rash noted. Not diaphoretic.No pallor. [place]ella: Alert and oriented to person, place, and time. Normal coordination.    Assessment:  Amenorrhea due to Depo Provera   Does not desire pregnancy at present   Declines contraception at present    Educated on side effects of Depo provera Breast cancer Screening      Plan:   F/u in 3 months for annual or sooner prn Condoms with intercourse.  screening mammogram    Total face-to-face time with patient was 15 min.  Greater than 50% was spent in counseling and coordination of care with the patient.   Landa Mullinax L. Harraway-Smith, M.D., Cherlynn June

## 2019-04-27 NOTE — Progress Notes (Signed)
Interpreter Taous. Pt states LMP was 12/10/18.

## 2019-05-11 ENCOUNTER — Ambulatory Visit (HOSPITAL_BASED_OUTPATIENT_CLINIC_OR_DEPARTMENT_OTHER): Payer: Medicaid Other

## 2019-05-18 ENCOUNTER — Ambulatory Visit (HOSPITAL_BASED_OUTPATIENT_CLINIC_OR_DEPARTMENT_OTHER): Payer: Medicaid Other

## 2019-12-03 ENCOUNTER — Ambulatory Visit: Payer: Medicaid Other | Attending: Internal Medicine

## 2019-12-03 DIAGNOSIS — Z20822 Contact with and (suspected) exposure to covid-19: Secondary | ICD-10-CM

## 2019-12-04 LAB — NOVEL CORONAVIRUS, NAA: SARS-CoV-2, NAA: NOT DETECTED

## 2024-02-25 ENCOUNTER — Emergency Department (HOSPITAL_COMMUNITY)
Admission: EM | Admit: 2024-02-25 | Discharge: 2024-02-26 | Disposition: A | Discharge status: Home / Self Care | Attending: Emergency Medicine | Admitting: Emergency Medicine

## 2024-02-25 ENCOUNTER — Emergency Department (HOSPITAL_COMMUNITY)

## 2024-02-25 ENCOUNTER — Encounter (HOSPITAL_COMMUNITY): Payer: Self-pay | Admitting: Emergency Medicine

## 2024-02-25 DIAGNOSIS — W5501XA Bitten by cat, initial encounter: Secondary | ICD-10-CM | POA: Diagnosis not present

## 2024-02-25 DIAGNOSIS — Z23 Encounter for immunization: Secondary | ICD-10-CM | POA: Insufficient documentation

## 2024-02-25 DIAGNOSIS — S61551A Open bite of right wrist, initial encounter: Secondary | ICD-10-CM | POA: Insufficient documentation

## 2024-02-25 MED ORDER — TETANUS-DIPHTH-ACELL PERTUSSIS 5-2.5-18.5 LF-MCG/0.5 IM SUSY
0.5000 mL | PREFILLED_SYRINGE | Freq: Once | INTRAMUSCULAR | Status: AC
  Administered 2024-02-26: 0.5 mL via INTRAMUSCULAR
  Filled 2024-02-25: qty 0.5

## 2024-02-25 MED ORDER — AMOXICILLIN-POT CLAVULANATE 875-125 MG PO TABS
1.0000 | ORAL_TABLET | Freq: Once | ORAL | Status: AC
  Administered 2024-02-26: 1 via ORAL
  Filled 2024-02-25: qty 1

## 2024-02-25 NOTE — ED Provider Notes (Signed)
  EMERGENCY DEPARTMENT AT [facility] Provider Note   CSN: [id number] Arrival date & time: 02/25/24  2251     History {Add pertinent medical, surgical, social history, OB history to HPI:1} Chief Complaint  Patient presents with   Animal Bite         Belinda Blake is a 43 y.o. female.   Animal Bite      Home Medications Prior to Admission medications   Medication Sig Start Date End Date Taking? Authorizing Provider  acetaminophen  (TYLENOL ) 500 MG tablet Take 500 mg by mouth every 6 (six) hours as needed for moderate pain.    [provider]  cephALEXin  (KEFLEX ) 500 MG capsule Take 1 capsule (500 mg total) by mouth 3 (three) times daily. Patient not taking: Reported on 01/10/2018 12/30/17   Stinson, Jacob J, DO  diazepam  (VALIUM ) 5 MG tablet Take 1 tablet (5 mg total) by mouth every 12 (twelve) hours as needed for muscle spasms. Patient not taking: Reported on 04/27/2019 03/20/19   Mandy Second, PA-C  diazepam  (VALIUM ) 5 MG tablet Take 1 tablet (5 mg total) by mouth 2 (two) times daily. Patient not taking: Reported on 04/27/2019 03/20/19   Mandy Second, PA-C  ferrous sulfate  (FERROUSUL) 325 (65 FE) MG tablet Take 1 tablet (325 mg total) by mouth 3 (three) times daily with meals. Patient not taking: Reported on 12/30/2017 09/30/17   Stinson, Jacob J, DO  HYDROcodone -acetaminophen  (NORCO/VICODIN) 5-325 MG tablet Take 1 tablet by mouth every 4 (four) hours as needed. Patient not taking: Reported on 04/27/2019 03/20/19   Mandy Second, PA-C  ibuprofen  (ADVIL ,MOTRIN ) 600 MG tablet Take 1 tablet (600 mg total) by mouth every 6 (six) hours. Patient not taking: Reported on 03/20/2019 12/12/17   Rumball, Alison M, DO  medroxyPROGESTERone  (DEPO-PROVERA ) 150 MG/ML injection Inject 1 mL (150 mg total) into the muscle every 3 (three) months. Patient not taking: Reported on 03/20/2019 06/13/18   Harlee Lichtenstein, CNM  medroxyPROGESTERone  (DEPO-PROVERA ) 150 MG/ML injection  Inject 1 mL (150 mg total) into the muscle every 3 (three) months. Patient not taking: Reported on 03/20/2019 09/25/18   Lenord Radon, MD  metroNIDAZOLE  (FLAGYL ) 500 MG tablet Take 1 tablet (500 mg total) by mouth 2 (two) times daily. Patient not taking: Reported on 03/20/2019 10/02/18   Lenord Radon, MD  oxyCODONE -acetaminophen  (PERCOCET/ROXICET) 5-325 MG tablet Take 1-2 tablets by mouth every 4 (four) hours as needed for severe pain. Patient not taking: Reported on 12/30/2017 12/12/17   Rumball, Alison M, DO  predniSONE  (DELTASONE ) 10 MG tablet Take 6 on day 1 Take 5 on day 2 Take 4 on day 3 Take 3 on day 4 Take 2 on day 5 Take 1 on day 6 Patient not taking: Reported on 04/27/2019 03/20/19   Mandy Second, PA-C  Prenatal Multivit-Min-Fe-FA (PRENATAL VITAMINS) 0.8 MG tablet Take 1 tablet by mouth daily. Patient not taking: Reported on 07/05/2018 07/11/17   Lenord Radon, MD  simethicone  [facility]) 80 MG chewable tablet Chew 1 tablet (80 mg total) by mouth as needed for flatulence. Patient not taking: Reported on 12/30/2017 12/12/17   Rumball, Alison M, DO  terconazole  (TERAZOL 7 ) 0.4 % vaginal cream Place 1 applicator vaginally at bedtime. Patient not taking: Reported on 07/05/2018 04/04/18   Harlee Lichtenstein, CNM  Vitamin D , Ergocalciferol , (DRISDOL ) 50000 units CAPS capsule Take 1 capsule (50,000 Units total) by mouth every 7 (seven) days. Patient not taking: Reported on 03/20/2019 07/11/17   Lenord Radon, MD  Allergies    Patient has no known allergies.    Review of Systems   Review of Systems  Physical Exam Updated Vital Signs BP 113/72 (BP Location: Left Arm)   Pulse (!) 101   Temp 98.2 F (36.8 C) (Oral)   Resp 16   SpO2 99%  Physical Exam  ED Results / Procedures / Treatments   Labs (all labs ordered are listed, but only abnormal results are displayed) Labs Reviewed - No data to display  EKG None  Radiology DG Forearm Right Result  Date: 02/25/2024 CLINICAL DATA:  Cat bite to the right forearm. EXAM: RIGHT FOREARM - 2 VIEW COMPARISON:  None Available. FINDINGS: There is no evidence of fracture or other focal bone lesions. Soft tissues are unremarkable. No radiopaque soft tissue foreign bodies or soft tissue gas identified. IMPRESSION: Negative. Electronically Signed   By: Boyce Byes M.D.   On: 02/25/2024 23:15    Procedures Procedures  {Document cardiac monitor, telemetry assessment procedure when appropriate:1}  Medications Ordered in ED Medications  Tdap (BOOSTRIX) injection 0.5 mL (has no administration in time range)  amoxicillin-clavulanate (AUGMENTIN) 875-125 MG per tablet 1 tablet (has no administration in time range)    ED Course/ Medical Decision Making/ A&P   {   Click here for ABCD2, HEART and other calculatorsREFRESH Note before signing :1}                              Medical Decision Making Amount and/or Complexity of Data Reviewed Radiology: ordered.  Risk Prescription drug management.   ***  {Document critical care time when appropriate:1} {Document review of labs and clinical decision tools ie heart score, Chads2Vasc2 etc:1}  {Document your independent review of radiology images, and any outside records:1} {Document your discussion with family members, caretakers, and with consultants:1} {Document social determinants of health affecting pt's care:1} {Document your decision making why or why not admission, treatments were needed:1} Final Clinical Impression(s) / ED Diagnoses Final diagnoses:  None    Rx / DC Orders ED Discharge Orders     None

## 2024-02-25 NOTE — ED Triage Notes (Addendum)
 Pt reports her cat bit her R forearm a few minutes ago. She was trying to give her cat a bath. Bleeding controlled. She cleaned it and washed it out at home.

## 2024-02-26 MED ORDER — AMOXICILLIN-POT CLAVULANATE 875-125 MG PO TABS: 1.0000 | ORAL_TABLET | Freq: Two times a day (BID) | ORAL | 0 refills | Status: AC

## 2024-04-15 ENCOUNTER — Emergency Department (HOSPITAL_COMMUNITY)
Admission: EM | Admit: 2024-04-15 | Discharge: 2024-04-15 | Disposition: A | Discharge status: Home / Self Care | Source: Ambulatory Visit | Attending: Emergency Medicine | Admitting: Emergency Medicine

## 2024-04-15 ENCOUNTER — Emergency Department (HOSPITAL_COMMUNITY)

## 2024-04-15 ENCOUNTER — Other Ambulatory Visit: Payer: Self-pay

## 2024-04-15 DIAGNOSIS — R0789 Other chest pain: Secondary | ICD-10-CM | POA: Insufficient documentation

## 2024-04-15 DIAGNOSIS — M79662 Pain in left lower leg: Secondary | ICD-10-CM | POA: Diagnosis present

## 2024-04-15 DIAGNOSIS — X58XXXA Exposure to other specified factors, initial encounter: Secondary | ICD-10-CM | POA: Insufficient documentation

## 2024-04-15 DIAGNOSIS — S8012XA Contusion of left lower leg, initial encounter: Secondary | ICD-10-CM | POA: Diagnosis not present

## 2024-04-15 DIAGNOSIS — T148XXA Other injury of unspecified body region, initial encounter: Secondary | ICD-10-CM

## 2024-04-15 DIAGNOSIS — M7989 Other specified soft tissue disorders: Secondary | ICD-10-CM | POA: Diagnosis not present

## 2024-04-15 LAB — CBC
HCT: 37.6 % (ref 36.0–46.0)
Hemoglobin: 11.4 g/dL — ABNORMAL LOW (ref 12.0–15.0)
MCH: 26.5 pg (ref 26.0–34.0)
MCHC: 30.3 g/dL (ref 30.0–36.0)
MCV: 87.4 fL (ref 80.0–100.0)
Platelets: 295 K/uL (ref 150–400)
RBC: 4.3 MIL/uL (ref 3.87–5.11)
RDW: 13.3 % (ref 11.5–15.5)
WBC: 7.8 K/uL (ref 4.0–10.5)
nRBC: 0 % (ref 0.0–0.2)

## 2024-04-15 LAB — BASIC METABOLIC PANEL WITH GFR
Anion gap: 11 (ref 5–15)
BUN: 12 mg/dL (ref 6–20)
CO2: 23 mmol/L (ref 22–32)
Calcium: 9.3 mg/dL (ref 8.9–10.3)
Chloride: 102 mmol/L (ref 98–111)
Creatinine, Ser: 0.53 mg/dL (ref 0.44–1.00)
GFR, Estimated: >60 mL/min (ref >60)
Glucose, Bld: 76 mg/dL (ref 70–99)
Potassium: 4.1 mmol/L (ref 3.5–5.1)
Sodium: 136 mmol/L (ref 135–145)

## 2024-04-15 LAB — TROPONIN I (HIGH SENSITIVITY): Troponin I (High Sensitivity): <2 ng/L (ref <18)

## 2024-04-15 LAB — HCG, SERUM, QUALITATIVE: Preg, Serum: NEGATIVE

## 2024-04-15 MED ORDER — ACETAMINOPHEN 500 MG PO TABS
1000.0000 mg | ORAL_TABLET | Freq: Once | ORAL | Status: AC
  Administered 2024-04-15: 1000 mg via ORAL
  Filled 2024-04-15: qty 2

## 2024-04-15 NOTE — Progress Notes (Signed)
 LLE venous duplex has been completed.  Preliminary results given to Dr. Bernard.   Results can be found under chart review under CV PROC. 04/15/2024 5:23 PM Krue Peterka RVT, RDMS

## 2024-04-15 NOTE — ED Provider Notes (Signed)
 [place] [facility]MENT AT [facility] Provider Note   CSN: [id number] Arrival date & time: 04/15/24  1412     Patient presents with: Leg Swelling and Chest Pain   Belinda Blake is a 43 y.o. female.   Pt c/o left lower leg pain and ?mild swelling. Pt indicates has noted symptoms in past couple days. Also notes a some bruising spots to lower leg, but denies hx trauma or injury. No hx dvt or pe. No ankle or knee pain. No fever/chills. Other than the couple areas of focal faint bruising denies any other skin lesions or  rash. Indicates had transient sharp pain to left upper chest/left shoulder area - pt indicates unsure of that was due to stress/anxiety. No exertional chest pain. No constant and/or pleuritic chest pain. No sob, nv or diaphoresis. No fever or chills. No cough or uri symptoms. No recent surgery, immobility, or trauma.   The history is provided by the patient and medical records.  Chest Pain Associated symptoms: no abdominal pain, no back pain, no cough, no fever, no headache, no nausea, no numbness, no palpitations, no shortness of breath, no vomiting and no weakness        Prior to Admission medications   Medication Sig Start Date End Date Taking? Authorizing Provider  acetaminophen  (TYLENOL ) 500 MG tablet Take 500 mg by mouth every 6 (six) hours as needed for moderate pain.    [provider]  amoxicillin -clavulanate (AUGMENTIN ) 875-125 MG tablet Take 1 tablet by mouth every 12 (twelve) hours. 02/26/24   Palumbo, April, MD  cephALEXin  (KEFLEX ) 500 MG capsule Take 1 capsule (500 mg total) by mouth 3 (three) times daily. Patient not taking: Reported on 01/10/2018 12/30/17   Stinson, Jacob J, DO  diazepam  (VALIUM ) 5 MG tablet Take 1 tablet (5 mg total) by mouth every 12 (twelve) hours as needed for muscle spasms. Patient not taking: Reported on 04/27/2019 03/20/19   Odell Balls, PA-C  diazepam  (VALIUM ) 5 MG tablet Take 1 tablet (5 mg total) by mouth 2  (two) times daily. Patient not taking: Reported on 04/27/2019 03/20/19   Odell Balls, PA-C  ferrous sulfate  (FERROUSUL) 325 (65 FE) MG tablet Take 1 tablet (325 mg total) by mouth 3 (three) times daily with meals. Patient not taking: Reported on 12/30/2017 09/30/17   Stinson, Jacob J, DO  HYDROcodone -acetaminophen  (NORCO/VICODIN) 5-325 MG tablet Take 1 tablet by mouth every 4 (four) hours as needed. Patient not taking: Reported on 04/27/2019 03/20/19   Odell Balls, PA-C  ibuprofen  (ADVIL ,MOTRIN ) 600 MG tablet Take 1 tablet (600 mg total) by mouth every 6 (six) hours. Patient not taking: Reported on 03/20/2019 12/12/17   Rumball, Alison M, DO  medroxyPROGESTERone  (DEPO-PROVERA ) 150 MG/ML injection Inject 1 mL (150 mg total) into the muscle every 3 (three) months. Patient not taking: Reported on 03/20/2019 06/13/18   Trudy Earnie CROME, CNM  medroxyPROGESTERone  (DEPO-PROVERA ) 150 MG/ML injection Inject 1 mL (150 mg total) into the muscle every 3 (three) months. Patient not taking: Reported on 03/20/2019 09/25/18   Corene Coy, MD  metroNIDAZOLE  (FLAGYL ) 500 MG tablet Take 1 tablet (500 mg total) by mouth 2 (two) times daily. Patient not taking: Reported on 03/20/2019 10/02/18   Corene Coy, MD  oxyCODONE -acetaminophen  (PERCOCET/ROXICET) 5-325 MG tablet Take 1-2 tablets by mouth every 4 (four) hours as needed for severe pain. Patient not taking: Reported on 12/30/2017 12/12/17   Rumball, Alison M, DO  predniSONE  (DELTASONE ) 10 MG tablet Take 6 on day 1  Take 5 on day 2 Take 4 on day 3 Take 3 on day 4 Take 2 on day 5 Take 1 on day 6 Patient not taking: Reported on 04/27/2019 03/20/19   Odell Balls, PA-C  Prenatal Multivit-Min-Fe-FA (PRENATAL VITAMINS) 0.8 MG tablet Take 1 tablet by mouth daily. Patient not taking: Reported on 07/05/2018 07/11/17   Corene Coy, MD  simethicone  (MYLICON) 80 MG chewable tablet Chew 1 tablet (80 mg total) by mouth as needed for flatulence. Patient  not taking: Reported on 12/30/2017 12/12/17   Rumball, Alison M, DO  terconazole  (TERAZOL 7 ) 0.4 % vaginal cream Place 1 applicator vaginally at bedtime. Patient not taking: Reported on 07/05/2018 04/04/18   Trudy Earnie CROME, CNM  Vitamin D , Ergocalciferol , (DRISDOL ) 50000 units CAPS capsule Take 1 capsule (50,000 Units total) by mouth every 7 (seven) days. Patient not taking: Reported on 03/20/2019 07/11/17   Corene Coy, MD    Allergies: Patient has no known allergies.    Review of Systems  Constitutional:  Negative for chills and fever.  HENT:  Negative for sore throat.   Respiratory:  Negative for cough and shortness of breath.   Cardiovascular:  Positive for chest pain. Negative for palpitations.  Gastrointestinal:  Negative for abdominal pain, nausea and vomiting.  Genitourinary:  Negative for flank pain.  Musculoskeletal:  Negative for back pain and neck pain.  Skin:  Negative for rash.  Neurological:  Negative for weakness, numbness and headaches.  Hematological:  Does not bruise/bleed easily.    Updated Vital Signs BP 108/69 (BP Location: Left Arm)   Pulse 86   Temp 98.1 F (36.7 C) (Oral)   Resp 16   SpO2 98%   Physical Exam Vitals and nursing note reviewed.  Constitutional:      Appearance: Normal appearance. She is well-developed.  HENT:     Head: Atraumatic.     Nose: Nose normal.     Mouth/Throat:     Mouth: Mucous membranes are moist.  Eyes:     General: No scleral icterus.    Conjunctiva/sclera: Conjunctivae normal.  Neck:     Trachea: No tracheal deviation.  Cardiovascular:     Rate and Rhythm: Normal rate and regular rhythm.     Pulses: Normal pulses.     Heart sounds: Normal heart sounds. No murmur heard.    No friction rub. No gallop.  Pulmonary:     Effort: Pulmonary effort is normal. No respiratory distress.     Breath sounds: Normal breath sounds.  Abdominal:     General: Bowel sounds are normal. There is no distension.      Palpations: Abdomen is soft.     Tenderness: There is no abdominal tenderness.  Musculoskeletal:     Cervical back: Normal range of motion and neck supple. No rigidity. No muscular tenderness.     Comments: Small circular bruise to LLE above ankle, ?faint yellowish bruise to anterior lower leg. No gross sts noted. Compartments of LLE are soft, not tense, no mass felt, no severe swelling, no pain or pain out of proportion, no pain w rom at knee or ankle. Distal pulses palp. Good rom LLE at ankle and knee without pain. LLE is of normal color and warmth.   Skin:    General: Skin is warm and dry.     Findings: No rash.  Neurological:     Mental Status: She is alert.     Comments: Alert, speech normal. Motor/sens grossly intact bil.  Psychiatric:  Mood and Affect: Mood normal.     (all labs ordered are listed, but only abnormal results are displayed) Results for orders placed or performed during the hospital encounter of 04/15/24  Basic metabolic panel   Collection Time: 04/15/24  3:46 PM  Result Value Ref Range   Sodium 136 135 - 145 mmol/L   Potassium 4.1 3.5 - 5.1 mmol/L   Chloride 102 98 - 111 mmol/L   CO2 23 22 - 32 mmol/L   Glucose, Bld 76 70 - 99 mg/dL   BUN 12 6 - 20 mg/dL   Creatinine, Ser 9.46 0.44 - 1.00 mg/dL   Calcium 9.3 8.9 - 89.6 mg/dL   GFR, Estimated >39 >39 mL/min   Anion gap 11 5 - 15  CBC   Collection Time: 04/15/24  3:46 PM  Result Value Ref Range   WBC 7.8 4.0 - 10.5 K/uL   RBC 4.30 3.87 - 5.11 MIL/uL   Hemoglobin 11.4 (L) 12.0 - 15.0 g/dL   HCT 62.3 63.9 - 53.9 %   MCV 87.4 80.0 - 100.0 fL   MCH 26.5 26.0 - 34.0 pg   MCHC 30.3 30.0 - 36.0 g/dL   RDW 86.6 88.4 - 84.4 %   Platelets 295 150 - 400 K/uL   nRBC 0.0 0.0 - 0.2 %  hCG, serum, qualitative   Collection Time: 04/15/24  3:46 PM  Result Value Ref Range   Preg, Serum NEGATIVE NEGATIVE  Troponin I (High Sensitivity)   Collection Time: 04/15/24  3:46 PM  Result Value Ref Range   Troponin I  (High Sensitivity) <2 <18 ng/L      EKG: EKG Interpretation Date/Time:  Sunday April 15 2024 14:40:58 EDT Ventricular Rate:  87 PR Interval:  157 QRS Duration:  84 QT Interval:  342 QTC Calculation: 412 R Axis:   64  Text Interpretation: Sinus rhythm Confirmed by Bernard Drivers ([id number]) on 04/15/2024 3:53:20 PM  Radiology: VAS US  LOWER EXTREMITY VENOUS (DVT) (7a-7p) Result Date: 04/15/2024  Lower Venous DVT Study Patient Name:  Belinda Blake  Date of Exam:   04/15/2024 Medical Rec #: [id number]      Accession #:    [id number] Date of Birth: 1981/05/02       Patient Gender: F Patient Age:   15 years Exam Location:  [facility] Procedure:      VAS US  LOWER EXTREMITY VENOUS (DVT) Referring Phys: Arles Rumbold --------------------------------------------------------------------------------  Indications: Pain.  Comparison Study: Previous exam on 06/01/2017 was negative for DVT Performing Technologist: Ezzie Potters RVT, RDMS  Examination Guidelines: A complete evaluation includes B-mode imaging, spectral Doppler, color Doppler, and power Doppler as needed of all accessible portions of each vessel. Bilateral testing is considered an integral part of a complete examination. Limited examinations for reoccurring indications may be performed as noted. The reflux portion of the exam is performed with the patient in reverse Trendelenburg.  +-----+---------------+---------+-----------+----------+--------------+ RIGHTCompressibilityPhasicitySpontaneityPropertiesThrombus Aging +-----+---------------+---------+-----------+----------+--------------+ CFV  Full           Yes      Yes                                 +-----+---------------+---------+-----------+----------+--------------+   +---------+---------------+---------+-----------+----------+--------------+ LEFT     CompressibilityPhasicitySpontaneityPropertiesThrombus Aging  +---------+---------------+---------+-----------+----------+--------------+ CFV      Full           Yes      Yes                                 +---------+---------------+---------+-----------+----------+--------------+  SFJ      Full                                                        +---------+---------------+---------+-----------+----------+--------------+ FV Prox  Full           Yes      Yes                                 +---------+---------------+---------+-----------+----------+--------------+ FV Mid   Full           Yes      Yes                                 +---------+---------------+---------+-----------+----------+--------------+ FV DistalFull           Yes      Yes                                 +---------+---------------+---------+-----------+----------+--------------+ PFV      Full                                                        +---------+---------------+---------+-----------+----------+--------------+ POP      Full           Yes      Yes                                 +---------+---------------+---------+-----------+----------+--------------+ PTV      Full                                                        +---------+---------------+---------+-----------+----------+--------------+ PERO     Full                                                        +---------+---------------+---------+-----------+----------+--------------+     Summary: RIGHT: - No evidence of common femoral vein obstruction.   LEFT: - There is no evidence of deep vein thrombosis in the lower extremity.  - No cystic structure found in the popliteal fossa.  *See table(s) above for measurements and observations.    Preliminary    DG Tibia/Fibula Left Result Date: 04/15/2024 CLINICAL DATA:  Left leg pain and swelling. EXAM: LEFT TIBIA AND FIBULA - 2 VIEW COMPARISON:  None Available. FINDINGS: There is no evidence of fracture or other focal bone lesions.  Cortical margins of the tibia and fibula are intact. No erosions or periostitis. Knee and ankle alignment are maintained. Soft tissue edema is most prominent distally. No radiopaque foreign body or soft tissue gas. IMPRESSION: Soft tissue  edema. No acute osseous abnormality. Electronically Signed   By: Andrea Gasman M.D.   On: 04/15/2024 16:59   DG Chest Port 1 View Result Date: 04/15/2024 CLINICAL DATA:  Chest pain. EXAM: PORTABLE CHEST 1 VIEW COMPARISON:  None Available. FINDINGS: The cardiomediastinal contours are normal. The lungs are clear. Pulmonary vasculature is normal. No consolidation, pleural effusion, or pneumothorax. No acute osseous abnormalities are seen. IMPRESSION: No active disease. Electronically Signed   By: Andrea Gasman M.D.   On: 04/15/2024 16:59     Procedures   Medications Ordered in the ED - No data to display                                  Medical Decision Making Problems Addressed: Atypical chest pain: acute illness or injury with systemic symptoms Bruising: acute illness or injury Pain of left lower leg: acute illness or injury  Amount and/or Complexity of Data Reviewed External Data Reviewed: notes. Labs: ordered. Decision-making details documented in ED Course. Radiology: ordered and independent interpretation performed. Decision-making details documented in ED Course. ECG/medicine tests: ordered and independent interpretation performed. Decision-making details documented in ED Course.  Risk OTC drugs. Decision regarding hospitalization.   Iv ns. Continuous pulse ox and cardiac monitoring. Labs ordered/sent. Imaging ordered.   Differential diagnosis includes  . Dispo decision including potential need for admission considered - will get labs and imaging and reassess.   Reviewed nursing notes and prior charts for additional history. External reports reviewed.   Cardiac monitor: sinus rhythm, rate 80.  Labs reviewed/interpreted by me - chem  normal. Wbc and hct normal. Preg neg. Trop normal.   Xrays reviewed/interpreted by me - no fx.   US  reviewed/interpreted by me - no dvt.   Pt currently appears stable for ed d/c.   Rec close pcp f/u.  Return precautions provided.       Final diagnoses:  None    ED Discharge Orders     None          Bernard Drivers, MD 04/15/24 1801

## 2024-04-15 NOTE — Discharge Instructions (Addendum)
 It was our pleasure to provide your ER care today - we hope that you feel better.  Overall, your ER tests look good - no blood clot was noted.  Take acetaminophen  or ibuprofen  as need.   Follow up closely with primary care doctor in 1-2 weeks if symptoms fail to improve/resolve.  Return to ER if worse, new symptoms, fevers, increased or severe leg swelling, severe pain, chest pain, trouble breathing, or other concern.

## 2024-04-15 NOTE — ED Triage Notes (Signed)
 Pt arrives with c/o left leg swelling and pain- tender to touch. Pt also reports chest pain that radiates down her left arm.
